# Patient Record
Sex: Male | Born: 1945
Health system: Southern US, Community
[De-identification: ages and names within clinical notes are randomized; demographics above are authoritative.]

## PROBLEM LIST (undated history)

## (undated) ENCOUNTER — Emergency Department (HOSPITAL_COMMUNITY): Payer: Medicare Other

## (undated) DIAGNOSIS — K222 Esophageal obstruction: Secondary | ICD-10-CM

## (undated) DIAGNOSIS — E669 Obesity, unspecified: Secondary | ICD-10-CM

## (undated) DIAGNOSIS — K219 Gastro-esophageal reflux disease without esophagitis: Secondary | ICD-10-CM

## (undated) DIAGNOSIS — L570 Actinic keratosis: Secondary | ICD-10-CM

## (undated) DIAGNOSIS — R079 Chest pain, unspecified: Secondary | ICD-10-CM

## (undated) DIAGNOSIS — L989 Disorder of the skin and subcutaneous tissue, unspecified: Secondary | ICD-10-CM

## (undated) DIAGNOSIS — R0789 Other chest pain: Secondary | ICD-10-CM

## (undated) DIAGNOSIS — R011 Cardiac murmur, unspecified: Secondary | ICD-10-CM

## (undated) DIAGNOSIS — Z8669 Personal history of other diseases of the nervous system and sense organs: Secondary | ICD-10-CM

## (undated) DIAGNOSIS — M1990 Unspecified osteoarthritis, unspecified site: Secondary | ICD-10-CM

## (undated) DIAGNOSIS — W57XXXA Bitten or stung by nonvenomous insect and other nonvenomous arthropods, initial encounter: Secondary | ICD-10-CM

## (undated) DIAGNOSIS — D485 Neoplasm of uncertain behavior of skin: Secondary | ICD-10-CM

## (undated) DIAGNOSIS — E785 Hyperlipidemia, unspecified: Secondary | ICD-10-CM

## (undated) DIAGNOSIS — R5381 Other malaise: Secondary | ICD-10-CM

## (undated) DIAGNOSIS — J309 Allergic rhinitis, unspecified: Secondary | ICD-10-CM

## (undated) DIAGNOSIS — R5383 Other fatigue: Secondary | ICD-10-CM

## (undated) DIAGNOSIS — M719 Bursopathy, unspecified: Secondary | ICD-10-CM

## (undated) DIAGNOSIS — M67919 Unspecified disorder of synovium and tendon, unspecified shoulder: Secondary | ICD-10-CM

## (undated) DIAGNOSIS — R7303 Prediabetes: Secondary | ICD-10-CM

## (undated) HISTORY — DX: Personal history of other diseases of the nervous system and sense organs: Z86.69

## (undated) HISTORY — DX: Hyperlipidemia, unspecified: E78.5

## (undated) HISTORY — PX: SKIN CANCER EXCISION: SHX779

## (undated) HISTORY — DX: Obesity, unspecified: E66.9

## (undated) HISTORY — DX: Prediabetes: R73.03

## (undated) HISTORY — DX: Allergic rhinitis, unspecified: J30.9

## (undated) HISTORY — DX: Chest pain, unspecified: R07.9

## (undated) HISTORY — DX: Disorder of the skin and subcutaneous tissue, unspecified: L98.9

## (undated) HISTORY — DX: Other malaise: R53.81

## (undated) HISTORY — DX: Neoplasm of uncertain behavior of skin: D48.5

## (undated) HISTORY — DX: Unspecified disorder of synovium and tendon, unspecified shoulder: M67.919

## (undated) HISTORY — DX: Esophageal obstruction: K22.2

## (undated) HISTORY — DX: Cardiac murmur, unspecified: R01.1

## (undated) HISTORY — DX: Unspecified osteoarthritis, unspecified site: M19.90

## (undated) HISTORY — DX: Other chest pain: R07.89

## (undated) HISTORY — DX: Other fatigue: R53.83

## (undated) HISTORY — DX: Actinic keratosis: L57.0

## (undated) HISTORY — DX: Gastro-esophageal reflux disease without esophagitis: K21.9

## (undated) HISTORY — DX: Bitten or stung by nonvenomous insect and other nonvenomous arthropods, initial encounter: W57.XXXA

## (undated) HISTORY — DX: Bursopathy, unspecified: M71.9

---

## 2006-07-26 ENCOUNTER — Ambulatory Visit: Payer: Self-pay | Admitting: Family Medicine

## 2006-07-28 ENCOUNTER — Ambulatory Visit: Payer: Self-pay | Admitting: Family Medicine

## 2006-08-03 ENCOUNTER — Ambulatory Visit: Payer: Self-pay

## 2006-08-09 ENCOUNTER — Ambulatory Visit: Payer: Self-pay

## 2006-08-09 ENCOUNTER — Encounter: Payer: Self-pay | Admitting: Cardiology

## 2006-08-10 ENCOUNTER — Ambulatory Visit: Payer: Self-pay | Admitting: Family Medicine

## 2006-09-02 ENCOUNTER — Ambulatory Visit: Payer: Self-pay | Admitting: Internal Medicine

## 2006-09-03 ENCOUNTER — Ambulatory Visit: Payer: Self-pay | Admitting: Gastroenterology

## 2006-09-16 LAB — HM COLONOSCOPY

## 2006-09-23 ENCOUNTER — Encounter (INDEPENDENT_AMBULATORY_CARE_PROVIDER_SITE_OTHER): Payer: Self-pay | Admitting: Specialist

## 2006-09-23 ENCOUNTER — Ambulatory Visit: Payer: Self-pay | Admitting: Internal Medicine

## 2007-10-12 ENCOUNTER — Telehealth: Payer: Self-pay | Admitting: Family Medicine

## 2007-11-30 ENCOUNTER — Ambulatory Visit: Payer: Self-pay | Admitting: Family Medicine

## 2007-11-30 DIAGNOSIS — M719 Bursopathy, unspecified: Secondary | ICD-10-CM

## 2007-11-30 DIAGNOSIS — M67919 Unspecified disorder of synovium and tendon, unspecified shoulder: Secondary | ICD-10-CM

## 2007-11-30 DIAGNOSIS — L570 Actinic keratosis: Secondary | ICD-10-CM

## 2007-11-30 DIAGNOSIS — Z8669 Personal history of other diseases of the nervous system and sense organs: Secondary | ICD-10-CM | POA: Insufficient documentation

## 2007-11-30 DIAGNOSIS — E669 Obesity, unspecified: Secondary | ICD-10-CM | POA: Insufficient documentation

## 2007-11-30 DIAGNOSIS — J309 Allergic rhinitis, unspecified: Secondary | ICD-10-CM

## 2007-11-30 DIAGNOSIS — K219 Gastro-esophageal reflux disease without esophagitis: Secondary | ICD-10-CM

## 2007-11-30 HISTORY — DX: Allergic rhinitis, unspecified: J30.9

## 2007-11-30 HISTORY — DX: Actinic keratosis: L57.0

## 2007-11-30 HISTORY — DX: Obesity, unspecified: E66.9

## 2007-11-30 HISTORY — DX: Personal history of other diseases of the nervous system and sense organs: Z86.69

## 2007-11-30 HISTORY — DX: Gastro-esophageal reflux disease without esophagitis: K21.9

## 2007-11-30 HISTORY — DX: Unspecified disorder of synovium and tendon, unspecified shoulder: M67.919

## 2007-12-01 ENCOUNTER — Encounter: Payer: Self-pay | Admitting: Family Medicine

## 2007-12-20 ENCOUNTER — Ambulatory Visit: Payer: Self-pay | Admitting: Family Medicine

## 2008-02-15 ENCOUNTER — Ambulatory Visit: Payer: Self-pay | Admitting: Family Medicine

## 2008-02-15 DIAGNOSIS — D485 Neoplasm of uncertain behavior of skin: Secondary | ICD-10-CM

## 2008-02-15 DIAGNOSIS — L989 Disorder of the skin and subcutaneous tissue, unspecified: Secondary | ICD-10-CM

## 2008-02-15 HISTORY — DX: Neoplasm of uncertain behavior of skin: D48.5

## 2008-02-15 HISTORY — DX: Disorder of the skin and subcutaneous tissue, unspecified: L98.9

## 2008-09-10 ENCOUNTER — Ambulatory Visit: Payer: Self-pay | Admitting: Family Medicine

## 2008-09-10 DIAGNOSIS — R5381 Other malaise: Secondary | ICD-10-CM

## 2008-09-10 DIAGNOSIS — R5383 Other fatigue: Secondary | ICD-10-CM

## 2008-09-10 HISTORY — DX: Other malaise: R53.81

## 2008-09-12 LAB — CONVERTED CEMR LAB
Albumin: 3.8 g/dL (ref 3.5–5.2)
Alkaline Phosphatase: 50 units/L (ref 39–117)
BUN: 11 mg/dL (ref 6–23)
Basophils Absolute: 0 10*3/uL (ref 0.0–0.1)
Basophils Relative: 0.2 % (ref 0.0–3.0)
Calcium: 9.4 mg/dL (ref 8.4–10.5)
Eosinophils Absolute: 0.2 10*3/uL (ref 0.0–0.7)
GFR calc Af Amer: 110 mL/min
GFR calc non Af Amer: 91 mL/min
Glucose, Bld: 116 mg/dL — ABNORMAL HIGH (ref 70–99)
Hemoglobin: 15.3 g/dL (ref 13.0–17.0)
Lymphocytes Relative: 24.1 % (ref 12.0–46.0)
MCHC: 35.7 g/dL (ref 30.0–36.0)
MCV: 91.7 fL (ref 78.0–100.0)
Neutro Abs: 5.3 10*3/uL (ref 1.4–7.7)
Neutrophils Relative %: 66.1 % (ref 43.0–77.0)
Potassium: 4 meq/L (ref 3.5–5.1)
RDW: 12.1 % (ref 11.5–14.6)
TSH: 2.47 microintl units/mL (ref 0.35–5.50)
Testosterone: 167.59 ng/dL — ABNORMAL LOW (ref 350.00–890)
Total Protein: 7 g/dL (ref 6.0–8.3)
Vit D, 1,25-Dihydroxy: 29 — ABNORMAL LOW (ref 30–89)
Vitamin B-12: 802 pg/mL (ref 211–911)

## 2010-12-07 ENCOUNTER — Encounter: Payer: Self-pay | Admitting: Otolaryngology

## 2011-04-03 NOTE — Assessment & Plan Note (Signed)
[facility]                             [facility] OFFICE NOTE   NAME:BORINGYale, [name]                        MRN:          [id number]  DATE:07/26/2006                            DOB:          May 31, 1946    CHIEF COMPLAINT:  A 65 year old white male here to establish new doctor.   HISTORY OF PRESENT ILLNESS:  [name] comes to clinic today with a typed  out note from his wife discussing six different reasons that she feels he  needs to see a doctor.  He states that he does not like coming to the doctor  and has not seen a doctor regularly in many years, other than an urgent care  physician.  The thing that is the most concern for him is the following:  1. Last night between 1:30 and 3 a.m. this morning, arose with a full      feeling in his abdomen that felt like gas.  He then went to the      bathroom, while he was on the toilet he stated he felt woozy and light-      headed.  He stated that he did pass some gas but did not have a bowel      movement and only moderately strained.  He denied any other feeling of      shortness of breath or palpitations at that point in time.  Following      the feeling of light headedness he proceeded to pass out next to the      toilet.  His wife said that he appeared sweaty and woke within 10 to 30      seconds. When he arose he said he felt 100% better, momentarily had      some chills and his fingers were tingly.  He denied headache, weakness      and his wife denied slurred speech.  He was acting within normal limits      earlier that day and feels like his normal self this morning.  He does      have somewhat of a slightly upset stomach but normal bowel movement      this morning.  He denies any pain with urinating, no increased urinary      frequency, no increased thirst.  He did take Melatonin last night for      sleep.  He denies any episode like this happening previously.  2. Difficulty swallowing:  For  many years he has had intermittent      difficulty swallowing especially foods such as carrots and chicken.  In      August of this past year he had a severe bout at a scout gathering in      which chicken seemed to not be able to move down his esophagus.  He has      never had endoscopy.  He denies heartburn symptoms, chest pain.  He      denies this issue with liquids.  3. Fatigue, chronic:  He states briefly that he has been fatigued off and  on for quite some time.  He has never had this worked up by a doctor.  4. Left shoulder pain:  He said he injured this about 1-2 months ago while      lifting weights.  He has some increased pain with left shoulder      abduction and internal rotation.  He feels it is almost healed, but his      wife is concerned about it.  5. Lesions on back:  His wife has some concern about some of the spots on      his back to make sure that he does not need to see a dermatologist.   PAST MEDICAL HISTORY:  None.   HOSPITALIZATIONS, SURGERY, PROCEDURES:  None.   PREVENTION:  None.   ALLERGIES:  No known drug allergies.   MEDICATIONS:  1. Multivitamin occasionally.  2. Melatonin occasionally.   REVIEW OF SYSTEMS:  Otherwise negative.  See HPI.   FAMILY HISTORY:  Father deceased age 40 with CVA.  Mother deceased at age 14  with motor vehicle accident, but did have hypertension. He has one sister  who is healthy.  There is no family history of prostate cancer, breast  cancer or colon cancer.   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  He has been  married for 28 years and has 2 children.  He does lift some weights and  bicycles about 10 miles every other day indoors.  He eats 3 meals a day and  tries to get lots of fluids.  He has recently stopped coke and has changed  to green tea in attempt to decrease caffeine in soda.   EXAMINATION:  VITAL SIGNS:  Height 57 1/2 inches.  Weight 197 making BMI  approximately 30.  Blood  pressure 130/86.  Pulse 92.  Temperature 98.2.  GENERAL:  Well-appearing male in no apparent distress.  HEENT:  PERLA extra ocular muscles intact.  Oropharynx clear.  Naris clear.  Tympanic membranes clear.  No lymphadenopathy, cervical or supraclavicular.  No thyromegaly.  CARDIOVASCULAR:  Regular rate and rhythm.  No murmurs, rubs or gallops.  Normal PMI.  2+ peripheral pulses.  PULMONARY:  Clear to auscultation bilaterally.  No wheezes, rales or  rhonchi.  ABDOMEN:  Soft. Obese.  Non-tender, normoactive bowel sounds, no  hepatosplenomegaly.  MUSCULOSKELETAL:  Strength 5/5 upper and lower extremities.  Gait was within  normal limits.  NEUROLOGICAL:  Alert and oriented x3.  Cranial nerves II through XII grossly  intact.  Reflex is 2+, sensation to touch intact in upper and lower  extremities.  SKIN:  Multiple seborrheic keratoses on back.   PROCEDURE:  EKG:  Q waves in lead 3 but otherwise no Q waves, no ST changes,  normal sinus rhythm.   ASSESSMENT/PLAN:  1. Syncopal event:  Will obtain several lab tests including CBC with      differentials, CMET, TSH.  I do feel most likely cause of his syncopal      event was situational vasovagal syncope secondary to defecation and      straining.  He may have had a side effect to Melatonin as well.  I do      have some concern about his coronary artery disease risk and given the      slightly abnormal EKG, if his lab tests come back within normal limits      I will encourage him to get a Cardiolite exam.  His description of  his      syncopal event does not seem to be proceeded by a cardiac event, nor      seem to be associated with a transient ischemic attack given his lack      of symptoms.  2. Dysphagia, chronic:  This sounds most like an esophageal stricture.  If      this continues to bother him, I recommended a referral to      gastroenterologist for possible stricture dilation or at least     esophagogastroduodenoscopy to evaluate  it.  3. Chronic fatigue:  I will begin with initial evaluation with some of the      labs as stated above.  In addition I will also check a vitamin B12.  If      his symptoms continue we can consider further workup.  4. Left rotator cuff tendinitis:  We discussed anti-inflammatories, heat      and rotator cuff exercises.  He will begin this conservative treatment      and will let me know if he continues to have problems.  5. Skin lesions:  These are benign seborrheic keratoses.  Patient was      reassured and there is no need to biopsy these lesions.                                   Kerby Nora, MD   AB/MedQ  DD:  07/26/2006  DT:  07/27/2006  Job #:  [id number]

## 2011-04-03 NOTE — Assessment & Plan Note (Signed)
[facility]                           GASTROENTEROLOGY OFFICE NOTE   NAME:[name], [name]                        MRN:          [id number]  DATE:09/02/2006                            DOB:          1946-06-21    REFERRING PHYSICIAN:  Kerby Nora, MD   REASON FOR CONSULTATION:  Abdominal pain and dysphagia.   HISTORY:  This is a 65 year old white male with no significant past medical  history who was referred through the courtesy of Dr. Ermalene Searing regarding  abdominal discomfort and dysphagia. First, the patient forged developing  problems with vague generalized mid abdominal pain about 4 weeks ago. At one  point in early September, he awoke with abdominal discomfort and had a  syncopal episode. He saw Dr. Ermalene Searing the following day. CBC comprehensive  metabolic panel, thyroid testing, B12 and folate levels were unremarkable.  He subsequently submitted to a cardiac nuclear stress test as well as a  transthoracic echocardiogram neither of which revealed significant  abnormalities. He had been on ibuprofen and aspirin, the former for  persistent shoulder pain. There were no problems with nausea, vomiting,  melena or hematochezia. He did describe his bowel habits as somewhat looser  and a bit yellow. He was placed on Prilosec which he has been on for  approximately 18 days. He has had no abdominal discomfort over the past  week. His stools have returned to more normal color. He attributes  his  improvement to pain medication for his shoulder which helps him sleep at  night. He is no longer using nonsteroidal antiinflammatory drugs. He denies  prior history of ulcer disease or gastrointestinal problems. No radiation  associated with the pain. He has had chronic intermittent solid food  dysphagia with rare episodes of heartburn. There has been no weight loss. He  does not smoke or use alcohol.   PAST MEDICAL HISTORY:  None.   PAST SURGICAL HISTORY:   None.   ALLERGIES:  No known drug allergies.   FAMILY HISTORY:  No family history of gastrointestinal malignancy. Father  had a history of stroke.   SOCIAL HISTORY:  The patient is married with 2 children. 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 He does not  smoke or use alcohol.   REVIEW OF SYSTEMS:  Per diagnostic evaluation form.   PHYSICAL EXAMINATION:  GENERAL:  Well-appearing in no acute distress.  VITAL SIGNS:  Blood pressure is 120/80, heart rate is 80 and regular, weight  is 189.6 pounds. He is 5 feet 6 inches in height.  HEENT:  Sclera are anicteric. Conjunctiva are pink. Oral mucosa is intact.  There is no adenopathy.  LUNGS:  Clear.  HEART:  Regular.  ABDOMEN:  Soft without tenderness, mass or hernia. Good bowel sounds heard.  No organomegaly.  RECTAL:  Deferred.  EXTREMITIES:  Without edema.   IMPRESSION:  1. Recent problems with generalized abdominal discomfort of uncertain      cause. Seemingly improved after initiation of Prilosec and      discontinuation of nonsteroidal antiinflammatory drugs. This discomfort  may have represented ulcer disease. With slight change in bowel habits      cannot exclude primary colonic pathology. As well, occult gallbladder      disease possible though less likely.  2. Chronic intermittent solid food dysphagia with some history of      indigestion and heartburn. Rule out peptic stricture versus web or ring      of the esophagus.  3. Colon cancer screening. None previous. Baseline candidate without      contraindication.   RECOMMENDATIONS:  1. Colonoscopy (with polypectomy if necessary) to provide colon cancer      screening as well as evaluate abdominal complaints. The nature of the      procedure as well as its risks, benefits, and alternatives were      carefully discussed in detail. He understood, asked questions and      agreed to proceed.  2. Upper endoscopy with possible  esophageal dilation. Again, the nature of      this procedure as well as its risks, benefits, and alternatives were      again reviewed. He understood and agreed to proceed.  3. Continue Prilosec.  4. Schedule abdominal ultrasound.  5. Ongoing general medical care with Dr. Ermalene Searing.            ______________________________  Wilhemina Bonito. Eda Keys., MD      JNP/MedQ  DD:  09/06/2006  DT:  09/07/2006  Job #:  [id number]   cc:   Kerby Nora, MD

## 2011-08-19 ENCOUNTER — Encounter: Payer: Self-pay | Admitting: Family Medicine

## 2011-08-21 ENCOUNTER — Encounter: Payer: Self-pay | Admitting: Family Medicine

## 2011-08-21 ENCOUNTER — Ambulatory Visit (INDEPENDENT_AMBULATORY_CARE_PROVIDER_SITE_OTHER): Payer: Medicare Other | Admitting: Family Medicine

## 2011-08-21 VITALS — BP 140/80 | HR 88 | Temp 98.5°F | Ht 66.0 in | Wt 206.8 lb

## 2011-08-21 DIAGNOSIS — Z23 Encounter for immunization: Secondary | ICD-10-CM

## 2011-08-21 DIAGNOSIS — D485 Neoplasm of uncertain behavior of skin: Secondary | ICD-10-CM

## 2011-08-21 NOTE — Assessment & Plan Note (Signed)
Area may be slow healing scab given his manipulation of area, but also concerning for carcinoma given no healing in high risk area in high risk person.  Refert to derm for eval and removal if needed given location on face.

## 2011-08-21 NOTE — Patient Instructions (Signed)
Stop at front desk for referral to Derm. Make appt for fasting labs followed by Annual Medicare Wellness.

## 2011-08-21 NOTE — Progress Notes (Signed)
  Subjective:    Patient ID: [name], male    DOB: November 13, 1946, 65 y.o.   MRN: [id number]  HPI  65 year old male presents with  lesion on forehead... noted first 5 weeks.  Appeared like a scab, may be decreasing in size some. At first pulled scab pff Not itchy, not irritated. Occ bleeding. May have hit forehead with pen, but not sure if this is the area.   Applying neosporin daily, occ alcohol.   No family or personal history of skin cancer.     Review of Systems  Constitutional: Negative for fever and fatigue.  HENT: Negative for ear pain.   Eyes: Negative for pain.       Objective:   Physical Exam  Constitutional: He appears well-developed and well-nourished.  Eyes: Conjunctivae are normal. Pupils are equal, round, and reactive to light.  Cardiovascular: Normal rate, regular rhythm, normal heart sounds and intact distal pulses.  Exam reveals no gallop and no friction rub.   No murmur heard. Pulmonary/Chest: Effort normal and breath sounds normal. No respiratory distress. He has no wheezes. He has no rales. He exhibits no tenderness.  Skin: Skin is dry. Lesion noted.       Pale 0.5 cm lesion right central forehead with scab on lwer half, no flake, no blister, no pustule  He has very fair... No reddish skin          Assessment & Plan:

## 2011-11-12 ENCOUNTER — Other Ambulatory Visit: Payer: Self-pay | Admitting: Dermatology

## 2011-11-18 ENCOUNTER — Telehealth: Payer: Self-pay | Admitting: Family Medicine

## 2011-11-18 ENCOUNTER — Other Ambulatory Visit: Payer: Medicare Other

## 2011-11-18 DIAGNOSIS — Z125 Encounter for screening for malignant neoplasm of prostate: Secondary | ICD-10-CM

## 2011-11-18 DIAGNOSIS — Z8669 Personal history of other diseases of the nervous system and sense organs: Secondary | ICD-10-CM

## 2011-11-18 DIAGNOSIS — E669 Obesity, unspecified: Secondary | ICD-10-CM

## 2011-11-18 DIAGNOSIS — Z13220 Encounter for screening for lipoid disorders: Secondary | ICD-10-CM

## 2011-11-18 NOTE — Telephone Encounter (Signed)
Message copied by Excell Seltzer on Wed Nov 18, 2011  4:31 PM ------      Message from: Alvina Chou      Created: Wed Nov 11, 2011  9:30 AM      Regarding: lab orders for 11-18-11       Patient is scheduled for CPX labs, please order future labs, Thanks , [name]

## 2011-11-23 ENCOUNTER — Encounter: Payer: Medicare Other | Admitting: Family Medicine

## 2011-11-24 ENCOUNTER — Encounter: Payer: Medicare Other | Admitting: Family Medicine

## 2011-11-26 ENCOUNTER — Encounter: Payer: Self-pay | Admitting: Family Medicine

## 2011-11-26 ENCOUNTER — Ambulatory Visit (INDEPENDENT_AMBULATORY_CARE_PROVIDER_SITE_OTHER): Payer: Medicare Other | Admitting: Family Medicine

## 2011-11-26 ENCOUNTER — Telehealth: Payer: Self-pay | Admitting: Internal Medicine

## 2011-11-26 DIAGNOSIS — R079 Chest pain, unspecified: Secondary | ICD-10-CM

## 2011-11-26 HISTORY — DX: Chest pain, unspecified: R07.9

## 2011-11-26 NOTE — Assessment & Plan Note (Signed)
EKG NSR, no ST no Q changes. Chest pain is not clearly cardiac but initially was exertional. He has some risk factors... Will assess others such as chol and DM screen ASAP.  If these put him at higher risk or if recurrent pain.. Consider cardiac stress test.  At this point chest pain seems most consistent with chest wall pain , secondary to lifting weights/ injury. He thinks he may have dropped the weights on his chest. Chest wall is tender to touch.

## 2011-11-26 NOTE — Patient Instructions (Signed)
Move fasting labs to as soon as possible, this week if possible or early next week. If chest pain recurs, call for further evaluation. If severe chest pain or SOB.. Go to ER.

## 2011-11-26 NOTE — Progress Notes (Signed)
  Subjective:    Patient ID: [name], male    DOB: July 16, 1946, 66 y.o.   MRN: [id number]  HPI  66 year old male with history of GERD and chronic fatigue presents with new onset chest pain 4 days ago. Left central tender to touch constant, intermittent sharper twinge pain when walking. Improved with rest. He has felt gradually less issue, feels better today. No SOB associated, no numbness, no sweats. Fatigue increased 4 days ago.  Walked for 1 hour today, and felt better.. No current exertional chest pain.  Increase in stress this week, with work. 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  Lifts weights .Marland Kitchen Started back two days prior to chest pain.  Cardiac risk factor: age, central obesity, no HTN, chol and DM screen scheduled, no early MI history in family, father with CVA age 71s, sister CVA age 40.  Had nml ECHO in 2009 Nml stress nuclear study in 2007       Review of Systems  Constitutional: Negative for fever and fatigue.  HENT: Negative for ear pain.   Eyes: Negative for pain.  Respiratory: Negative for cough and shortness of breath.   Cardiovascular: Negative for palpitations and leg swelling.  Gastrointestinal: Negative for abdominal pain.      Objective:   Physical Exam  Constitutional: Vital signs are normal. He appears well-developed and well-nourished.  HENT:  Head: Normocephalic.  Right Ear: Hearing normal.  Left Ear: Hearing normal.  Nose: Nose normal.  Mouth/Throat: Oropharynx is clear and moist and mucous membranes are normal.  Neck: Trachea normal. Carotid bruit is not present. No mass and no thyromegaly present.  Cardiovascular: Normal rate, regular rhythm and normal pulses.  Exam reveals no gallop, no distant heart sounds and no friction rub.   No murmur heard.      No peripheral edema  Pulmonary/Chest: Effort normal and breath sounds normal. No respiratory distress. Chest wall is not dull to percussion. He  exhibits tenderness. He exhibits no mass, no crepitus and no deformity.    Skin: Skin is warm, dry and intact. No rash noted.  Psychiatric: He has a normal mood and affect. His speech is normal and behavior is normal. Thought content normal.          Assessment & Plan:

## 2011-11-26 NOTE — Telephone Encounter (Signed)
Made him appointment with Dr. Lupita Shutter doctor at 2 for mild chest pains and tiredness, no other symptoms.

## 2011-11-27 ENCOUNTER — Telehealth: Payer: Self-pay | Admitting: Internal Medicine

## 2011-11-27 ENCOUNTER — Other Ambulatory Visit (INDEPENDENT_AMBULATORY_CARE_PROVIDER_SITE_OTHER): Payer: Medicare Other

## 2011-11-27 DIAGNOSIS — R7303 Prediabetes: Secondary | ICD-10-CM

## 2011-11-27 DIAGNOSIS — Z125 Encounter for screening for malignant neoplasm of prostate: Secondary | ICD-10-CM

## 2011-11-27 DIAGNOSIS — E669 Obesity, unspecified: Secondary | ICD-10-CM

## 2011-11-27 DIAGNOSIS — E7800 Pure hypercholesterolemia, unspecified: Secondary | ICD-10-CM | POA: Insufficient documentation

## 2011-11-27 HISTORY — DX: Prediabetes: R73.03

## 2011-11-27 LAB — COMPREHENSIVE METABOLIC PANEL
Albumin: 4.4 g/dL (ref 3.5–5.2)
Alkaline Phosphatase: 49 U/L (ref 39–117)
BUN: 15 mg/dL (ref 6–23)
Glucose, Bld: 102 mg/dL — ABNORMAL HIGH (ref 70–99)
Potassium: 4.7 mEq/L (ref 3.5–5.1)

## 2011-11-27 LAB — LIPID PANEL
Cholesterol: 203 mg/dL — ABNORMAL HIGH (ref 0–200)
Total CHOL/HDL Ratio: 4
Triglycerides: 95 mg/dL (ref 0.0–149.0)

## 2011-11-27 NOTE — Telephone Encounter (Signed)
I needed his fasting Labs moved up,... Is this what he is talking about or did he have a wellness visit as well (this does not need to be moved up)?  the labs need to be done at least by next week.

## 2011-11-27 NOTE — Telephone Encounter (Signed)
Patient advised.

## 2011-11-27 NOTE — Telephone Encounter (Signed)
Patient stated you requested his Well visit be moved up until next week.  He made an appointment for January 25 and he wanted to know if this was ok or if you want his appt. Sooner.  Please advise.

## 2011-12-02 ENCOUNTER — Encounter: Payer: Self-pay | Admitting: Cardiovascular Disease

## 2011-12-02 ENCOUNTER — Encounter: Payer: Self-pay | Admitting: Family Medicine

## 2011-12-02 ENCOUNTER — Encounter: Payer: Self-pay | Admitting: Cardiology

## 2011-12-02 ENCOUNTER — Ambulatory Visit (INDEPENDENT_AMBULATORY_CARE_PROVIDER_SITE_OTHER): Payer: Medicare Other | Admitting: Family Medicine

## 2011-12-02 ENCOUNTER — Ambulatory Visit (INDEPENDENT_AMBULATORY_CARE_PROVIDER_SITE_OTHER): Payer: Medicare Other | Admitting: Cardiovascular Disease

## 2011-12-02 DIAGNOSIS — R0789 Other chest pain: Secondary | ICD-10-CM | POA: Insufficient documentation

## 2011-12-02 DIAGNOSIS — R079 Chest pain, unspecified: Secondary | ICD-10-CM

## 2011-12-02 DIAGNOSIS — K219 Gastro-esophageal reflux disease without esophagitis: Secondary | ICD-10-CM

## 2011-12-02 HISTORY — DX: Other chest pain: R07.89

## 2011-12-02 MED ORDER — SUCRALFATE 1 GM/10ML PO SUSP: 1.0000 g | Freq: Four times a day (QID) | ORAL | Status: DC

## 2011-12-02 MED ORDER — PANTOPRAZOLE SODIUM 40 MG PO TBEC: 40.0000 mg | DELAYED_RELEASE_TABLET | Freq: Two times a day (BID) | ORAL | Status: DC

## 2011-12-02 NOTE — Progress Notes (Signed)
Patient ID: [name], male    DOB: 09-14-1946, 66 y.o.   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 He presents by referral from Dr. Ermalene Searing.   He reports that initially his chest pain started as a burning that has become progressively worse at nighttime. He denies any change in his diet. The symptoms feel somewhat different from his previous GERD symptoms. He is taking Carafate, has not started his proton pump inhibitor and has been taking low dose Zantac.  He has had such severe burning in his chest when supine but he has not been able to sleep for the past few days. Symptoms are relieved with sitting up.   He has been walking [place] a regular basis [place] the track and has not had any symptoms of chest discomfort with exertion. He has also had a pain in the left side of his mediastinum that is reproducible with palpation. He has been lifting some weights and wonders if he may have pulled something.   EKG shows normal sinus rhythm with rate 82 beats per minute with no significant ST or T wave changes   Outpatient Encounter Prescriptions as of 12/02/2011  Medication Sig Dispense Refill  . ALPHA LIPOIC ACID POWD Once daily       . cholecalciferol (VITAMIN D) 1000 UNITS tablet Take 1,000 Units by mouth daily.      . Melatonin 1 MG CAPS As needed.       . Multiple Vitamins-Minerals (CENTRUM SILVER PO) Take one by mouth daily       . pantoprazole (PROTONIX) 40 MG tablet Take 1 tablet (40 mg total) by mouth 2 (two) times daily.  60 tablet  5  . sucralfate (CARAFATE) 1 GM/10ML suspension Take 10 mLs (1 g total) by mouth 4 (four) times daily.  420 mL  0  . vitamin B-12 (CYANOCOBALAMIN) 1000 MCG tablet Take 1,000 mcg by mouth daily.        . vitamin C (ASCORBIC ACID) 500 MG tablet Take 500 mg by mouth daily.           Review of  Systems  Constitutional: Negative.   HENT: Negative.   Eyes: Negative.   Respiratory: Negative.   Cardiovascular: Positive for chest pain.       Chest burning when in a supine position. Also reproducible chest pain with palpation on the left side of his mediastinum  Gastrointestinal: Negative.   Musculoskeletal: Negative.   Skin: Negative.   Neurological: Negative.   Hematological: Negative.   Psychiatric/Behavioral: Negative.   All other systems reviewed and are negative.    BP 140/68  Pulse 82  Ht 5\' 6"  (1.676 m)  Wt 196 lb (88.905 kg)  BMI 31.64 kg/m2   Physical Exam  Nursing note and vitals reviewed. Constitutional: He is oriented to person, place, and time. He appears well-developed and well-nourished.  HENT:  Head: Normocephalic.  Nose: Nose normal.  Mouth/Throat: Oropharynx is clear and moist.  Eyes: Conjunctivae are normal. Pupils are equal, round, and reactive to light.  Neck: Normal range of motion. Neck supple. No JVD present.  Cardiovascular: Normal rate, regular rhythm, S1 normal, S2 normal, normal heart sounds and intact distal pulses.  Exam reveals no gallop and no friction rub.   No murmur heard. Pulmonary/Chest: Effort normal and breath sounds normal. No respiratory  distress. He has no wheezes. He has no rales. He exhibits no tenderness.  Abdominal: Soft. Bowel sounds are normal. He exhibits no distension. There is no tenderness.  Musculoskeletal: Normal range of motion. He exhibits no edema and no tenderness.       Chest wall tenderness on the left side of the mediastinum  Lymphadenopathy:    He has no cervical adenopathy.  Neurological: He is alert and oriented to person, place, and time. Coordination normal.  Skin: Skin is warm and dry. No rash noted. No erythema.  Psychiatric: He has a normal mood and affect. His behavior is normal. Judgment and thought content normal.           Assessment and Plan

## 2011-12-02 NOTE — Assessment & Plan Note (Signed)
Etiology of his burning chest pain when supine at night time is uncertain. Unable to exclude GERD as well as pericarditis. Also possible other musculoskeletal issue. We have suggested he continue his aggressive stomach/GERD medication regiment. If he has no improvement of his symptoms, I have suggested he try high-dose ibuprofen 600 up to 800 mg t.i.d.. If he has improvement of his pain with this, we would do a slow wean off the ibuprofen over 2 weeks.   If symptoms persist despite NSAIDs and PPIs, further workup could be done. Symptoms do not seem like underlying coronary artery disease as they are nonexertional, accentuated by change in position.
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
## 2011-12-02 NOTE — Patient Instructions (Signed)
You are doing well. If you try your mix of stomach medications with no relief, Try high dose ibuprofen 600 to 800 mg three times a day for 5 days. Call the office to let us know about your symptoms. If the ibuprofen helps, we will come down on the ibuprofen slowly.   Please call us if you have new issues that need to be addressed before your next appt.  Your physician wants you to follow-up in: 1 months.  You will receive a reminder letter in the mail two months in advance. If you don't receive a letter, please call our office to schedule the follow-up appointment.

## 2011-12-02 NOTE — Patient Instructions (Signed)
Diet for GERD or PUD Nutrition therapy can help ease the discomfort of gastroesophageal reflux disease (GERD) and peptic ulcer disease (PUD).  HOME CARE INSTRUCTIONS   Eat your meals slowly, in a relaxed setting.   Eat 5 to 6 small meals per day.   If a food causes distress, stop eating it for a period of time.  FOODS TO AVOID  Coffee, regular or decaffeinated.   Cola beverages, regular or low calorie.   Tea, regular or decaffeinated.   Pepper.   Cocoa.   High fat foods, including meats.   Butter, margarine, hydrogenated oil (trans fats).   Peppermint or spearmint (if you have GERD).   Fruits and vegetables if not tolerated.   Alcohol.   Nicotine (smoking or chewing). This is one of the most potent stimulants to acid production in the gastrointestinal tract.   Any food that seems to aggravate your condition.  If you have questions regarding your diet, ask your caregiver or a registered dietitian. TIPS  Lying flat may make symptoms worse. Keep the head of your bed raised 6 to 9 inches (15 to 23 cm) by using a foam wedge or blocks under the legs of the bed.   Do not lay down until 3 hours after eating a meal.   Daily physical activity may help reduce symptoms.  MAKE SURE YOU:   Understand these instructions.   Will watch your condition.   Will get help right away if you are not doing well or get worse.  Document Released: 11/02/2005 Document Revised: 07/15/2011 Document Reviewed: 03/18/2009 [employer] Patient Information 2012 [employer]. 

## 2011-12-02 NOTE — Assessment & Plan Note (Signed)
He will continue his Carafate, Zantac b.i.d., Protonix. If symptoms persist, he may need repeat EGD. I do have some concern that this could be secondary to pericarditis and we will try NSAIDs if there is no relief with GI cocktail.

## 2011-12-04 ENCOUNTER — Telehealth: Payer: Self-pay | Admitting: *Deleted

## 2011-12-04 ENCOUNTER — Telehealth: Payer: Self-pay | Admitting: Cardiovascular Disease

## 2011-12-04 NOTE — Telephone Encounter (Signed)
Yes protonix and carafate should help protect his stomach. Still if stomach upset with ibuprofen.. Stop and call us.

## 2011-12-04 NOTE — Telephone Encounter (Signed)
Patient was seen on Wed and was told to call back per Dr. Mariah Milling if symptoms persist or worsen.  Patient tried medications that were suggested and still has had no relief.  Still having the nagging chest pain and not able to sleep the past two nights.  Would like to know what he should do, please advise?

## 2011-12-04 NOTE — Telephone Encounter (Signed)
Patient's wife advised

## 2011-12-04 NOTE — Telephone Encounter (Signed)
Will forward to PCP 

## 2011-12-04 NOTE — Telephone Encounter (Signed)
A Rx for vicodin 5/500 mg take 1 to 2 tablets daily prn was called to [facility].

## 2011-12-04 NOTE — Telephone Encounter (Signed)
The patient states Dr. Mariah Milling mentioned a knot on on left upper chest (possibly rib related) popped a rib.  He would like a pain pill that was suggested by Dr. Mariah Milling sent to [facility].

## 2011-12-04 NOTE — Telephone Encounter (Signed)
The patient states it is not so much the burning sensation anymore its more the knot/rib issue.  I told the patient Dr. Mariah Milling would call in vicodin and have patient use warm compress over the weekend to see if this would help with knot/rib pain.  He will call back on Monday if symptoms not any better.

## 2011-12-04 NOTE — Telephone Encounter (Signed)
Patient has seen Dr. Patsy Lager and Dr. Mariah Milling and wants you to review their notes. Patient's wife states that patient is having a lot of pain in his chest area which could possibly to be a cracked rib. They have talked with Dr. Windell Hummingbird office today and he is going to prescirbe him Vicodin and he has suggested that patient take large doses of Ibuprofen also. Patient wants to know if the Protonix and Carafate that he is taking is going to coat his stomach enough to take the medications that Dr. Mariah Milling is advising him to take. Please advise.

## 2011-12-04 NOTE — Telephone Encounter (Signed)
Options include setting him up for a chest CT scan at [facility], We could have him do cardiac enz today as well (STAT).  We can schedule a myoview study for next week

## 2011-12-08 ENCOUNTER — Telehealth: Payer: Self-pay | Admitting: Family Medicine

## 2011-12-08 DIAGNOSIS — R0789 Other chest pain: Secondary | ICD-10-CM

## 2011-12-08 DIAGNOSIS — M25512 Pain in left shoulder: Secondary | ICD-10-CM

## 2011-12-08 NOTE — Telephone Encounter (Signed)
Please advise 

## 2011-12-08 NOTE — Telephone Encounter (Signed)
Patient advised.

## 2011-12-08 NOTE — Telephone Encounter (Signed)
Let pt know ideally I would recommend reeval in our office, but given he is frustrated with being seen multiple times... Given pain now more focused around  Left shoulder and cardiologist thought MSK could be source..will go ahead and refer to PT.  If relfux continuing I would also recommend further evaluationwith GI of GERD.

## 2011-12-08 NOTE — Telephone Encounter (Signed)
Triage Record Num: [id number] Operator: Chevis Pretty Patient Name: [name] Call Date & Time: 12/08/2011 10:21:01AM Patient Phone: [phone] PCP: Kerby Nora Patient Gender: Male PCP Fax : [phone] Patient DOB: 1946-10-26 Practice Name: [facility] Reason for Call: Caller: [place]/Patient; PCP: Excell Seltzer.; CB#: [phone]; ; ; Call regarding Chest Pain/Chest Discomfort. States having pain in upper L chest and top of L shoulder where rotator cuff needed physical therapy. No history of shoulder surgery. Onset of pain 11/17/11. States has seen PCP, cardiologist. Wants number for physical therapist he saw post op rotator cuff. Seen in office x 2 and thought there might be a rib injury. Cardiologist told them he could not tell if rib displaced, or cartilage tear. States he was referred 5 years ago to PT for shoulder/arm pain before, and thinks this may be a flareup of that. Denies emergent symptoms per protocol; declines appt at this time, as is very frustrated with coming to various doctors and continuing to have pain. Info to office for provider review/referral/call back. Prefers PT vs ortho referral. May reach patient [phone] or [phone]. Protocol(s) Used: Chest Pain Recommended Outcome per Protocol: See Provider within 72 Hours Reason for Outcome: Pain brought on by, or made worse by, pressure on a localized area (such as rib) AND not previously evaluated Frequent episodes of indigestion/reflux Care Advice: ~ Call EMS 911 if worsening shortness of breath or swelling in upper chest or neck occurs Call EMS 911 if having any of the following symptoms: chest, neck, jaw, arm, shoulder, or upper back pain; or shortness of breath at rest. ~ Do not take aspirin, ibuprofen, ketoprofen, naproxen, etc., or other pain relieving medications until consulting with provider. ~ Call provider if fever greater than 101.5 F (38.6 C) or 100.5 F (38.1C) in an  immunocompromised patient (such as diabetes, HIV/AIDS, renal disease, chemotherapy, organ transplant, or chronic steroid use) has not improved in 24 hours. ~ Call provider if difficulty breathing or wheezing develops or if cough becomes productive of green, yellow or brown sputum ~ Talk to provider immediately if having repeated episodes pain/discomfort in lower chest or back; indigestion not relieved with antacids; shortness of breath without chest pain; or unexplained fatigue, weakness or anxiety. ~ Call provider immediately if develop severe pain, black, tarry stools, bloody stools, blood-streaked or coffee ground-looking vomitus, or abdomen swollen. ~ ~ SYMPTOM / CONDITION MANAGEMENT ~ CAUTIONS Consider nonprescription low-sodium antacids (i.e. Mylanta, Maalox, Tums, Gelusil), H-2-receptor blockers (Tagamet HB, Pepcid AC, Zantac), or a proton pump inhibitor (Prilosec) following package directions or provider instructions. ~ Analgesic/Antipyretic Advice - Acetaminophen: Consider acetaminophen as directed on label or by pharmacist/provider for pain or fever PRECAUTIONS: - Use if there is no history of liver disease, alcoholism, or intake of three or more alcohol drinks per day - Only if approved by provider during pregnancy or when breastfeeding - During pregnancy, acetaminophen should not be taken more than 3 consecutive days without telling provider - Do not exceed recommended dose or frequency ~ 12/08/2011 10:34:51AM Page 1 of 2 [id number] [facility] Triage Call Report Patient Name: Kalvin Buss continuation page/s - Do not exceed recommended dose or frequency Discontinue nonprescribed medications and complementary/alternative medications. Continue prescribed medication(s) at ordered dosage/frequency until discussed with provider. ~ Analgesic/Antipyretic Advice - NSAIDs: Consider aspirin, ibuprofen, naproxen or ketoprofen for pain or fever as directed on label or by  pharmacist/provider. PRECAUTIONS: If over 67 years of age, should not take longer than 1 week without consulting provider. EXCEPTIONS: -  Should not be used if taking blood thinners or have bleeding problems. - Do not use if have history of sensitivity/allergy to any of these medications; or history of cardiovascular, ulcer, kidney, liver disease or diabetes unless approved by provider. - Do not exceed recommended dose or frequency. ~ Heartburn Relief (Dietary): - Avoid overeating. - Eat smaller, more frequent meals and chew each bite thoroughly. - Avoid high fat, spicy or gas-producing foods. - Limit liquids/foods that are gastric irritants (fruit juices, caffeinated, carbonated or alcoholic beverages, chocolate and dairy products). - Eat at least three hours before bedtime. ~ Heartburn Relief (Positioning): - Avoid bending over at the waist or lying flat soon after eating. - Avoid clothing that is tight around the abdomen and waist. - Sleeping on stacked pillows or raising the head of bed on 6 inch blocks may help prevent reflux. ~ 12/08/2011 10:34:51AM Page 2 of 2 [email or web address]_[email or web address]

## 2011-12-09 ENCOUNTER — Telehealth: Payer: Self-pay | Admitting: *Deleted

## 2011-12-09 NOTE — Telephone Encounter (Signed)
Sorry Jovita Gamma! I meant to send the note to Amy B.

## 2011-12-09 NOTE — Telephone Encounter (Signed)
If pain is present with pushing on that third rib, that will need vicodin/ibuprofen, Possible chiropractic adjustment, ice/cold pack may help.  Could do chest CT scan if he would like to exclude other causes. Also cardiac labs to help exclude the heart.  If only burning when lying down and possible pericarditis, would continue IBP (as well as stomach meds) Could try colchicine 0.6 mg TID with IBP.

## 2011-12-09 NOTE — Telephone Encounter (Signed)
Called pt to follow up with his CP after taking Ibuprofen and Vicodin. He states overall still feels about the same, if there is some improvement, it is very slow. Still c/o burning sensation and pain that is "hard to describe," on left side of chest, around 3rd rib and shoulder. He has no additional symptoms, denies SOB, diaphoresis, numbness, etc. I told pt I will notify Dr. Mariah Milling and advised he call back sooner with any changes. Otherwise will call him back with recommendations.

## 2011-12-09 NOTE — Telephone Encounter (Signed)
Spoke to pt, he asked if could use Prednisone to replace current pain meds (easier on stomach); told him Dr. Mariah Milling said not to use, but did suggest the colchicine that can be used for pericarditis. In the meantime he is going to continue Vicodin and IBP, he will see Dr. Ermalene Searing this Friday, he is going to start PT. He did state may go to [facility]. I recc CT scan or labs, he did not want to do at this time.

## 2011-12-10 ENCOUNTER — Telehealth: Payer: Self-pay | Admitting: Cardiovascular Disease

## 2011-12-10 NOTE — Telephone Encounter (Signed)
The patient is requesting vicodin now for his pain. You had mentioned from a previous phone note may need to try vicodin. He has been taking the ibuprofen but not helping. Please advise if you want me to send a Rx for vicodin.

## 2011-12-10 NOTE — Telephone Encounter (Signed)
Pt calling requesting a refill on his vicotin. States he needs it to sleep and he is almost out. [facility]

## 2011-12-11 ENCOUNTER — Telehealth: Payer: Self-pay | Admitting: *Deleted

## 2011-12-11 ENCOUNTER — Encounter: Payer: Medicare Other | Admitting: Family Medicine

## 2011-12-11 MED ORDER — COLCHICINE 0.6 MG PO TABS: 0.6000 mg | ORAL_TABLET | Freq: Two times a day (BID) | ORAL | Status: DC

## 2011-12-11 MED ORDER — HYDROCODONE-ACETAMINOPHEN 5-500 MG PO TABS: 2.0000 | ORAL_TABLET | Freq: Four times a day (QID) | ORAL | Status: DC | PRN

## 2011-12-11 NOTE — Telephone Encounter (Signed)
The patient was given a Rx from Dr. Mariah Milling 12/04/2011 for vicodin for 14 tablets.  The patient states the pain in left side of sternum and left rib area is better with the vicodin.  He would like to have either the vicodin called again or something different. He does not have any pain during the day when up and moving the symptoms are mostly when lying down. Do you feel this is pericarditis? The patient was to follow up with Dr. Ermalene Searing today but appointment cancelled due to bad weather.  I told the patient to contact PCP. The patient is confused on who to call and just trying to figure out what his problem is. I told the patient symptoms per Dr. Mariah Milling do not seem cardiac but we would get back with him regarding his symptoms.

## 2011-12-11 NOTE — Telephone Encounter (Signed)
Rx's sent in per Dr. Mariah Milling.

## 2011-12-11 NOTE — Telephone Encounter (Signed)
Suggest: Refill vicodin for pain, #30 take 1 to 2 q 4 to 6 hrs PRN Follow up with PMD Could call in colchicine 0.6 mg take BID prn for possible pericarditis, take with ibuprofen See PT/chiropractic Could consider a CT scan of the chest if sx persist

## 2011-12-15 ENCOUNTER — Other Ambulatory Visit: Payer: Medicare Other

## 2011-12-15 DIAGNOSIS — R071 Chest pain on breathing: Secondary | ICD-10-CM | POA: Diagnosis not present

## 2011-12-18 ENCOUNTER — Encounter: Payer: Medicare Other | Admitting: Family Medicine

## 2011-12-25 ENCOUNTER — Encounter: Payer: Medicare Other | Admitting: Family Medicine

## 2011-12-30 DIAGNOSIS — Z85828 Personal history of other malignant neoplasm of skin: Secondary | ICD-10-CM | POA: Diagnosis not present

## 2011-12-30 DIAGNOSIS — L719 Rosacea, unspecified: Secondary | ICD-10-CM | POA: Diagnosis not present

## 2011-12-30 DIAGNOSIS — L570 Actinic keratosis: Secondary | ICD-10-CM | POA: Diagnosis not present

## 2011-12-31 DIAGNOSIS — R071 Chest pain on breathing: Secondary | ICD-10-CM | POA: Diagnosis not present

## 2012-01-06 ENCOUNTER — Encounter: Payer: Self-pay | Admitting: Family Medicine

## 2012-01-06 ENCOUNTER — Ambulatory Visit (INDEPENDENT_AMBULATORY_CARE_PROVIDER_SITE_OTHER): Payer: Medicare Other | Admitting: Family Medicine

## 2012-01-06 VITALS — BP 120/70 | HR 87 | Temp 98.7°F | Ht 66.0 in | Wt 192.8 lb

## 2012-01-06 DIAGNOSIS — R972 Elevated prostate specific antigen [PSA]: Secondary | ICD-10-CM | POA: Diagnosis not present

## 2012-01-06 DIAGNOSIS — N400 Enlarged prostate without lower urinary tract symptoms: Secondary | ICD-10-CM | POA: Diagnosis not present

## 2012-01-06 DIAGNOSIS — Z0000 Encounter for general adult medical examination without abnormal findings: Secondary | ICD-10-CM | POA: Diagnosis not present

## 2012-01-06 NOTE — Patient Instructions (Signed)
REFERRAL: GO THE THE FRONT ROOM AT THE ENTRANCE OF OUR CLINIC, NEAR CHECK IN. ASK FOR MARION. SHE WILL HELP YOU SET UP YOUR REFERRAL. DATE: TIME:  

## 2012-01-06 NOTE — Progress Notes (Signed)
Patient Name: [name] Date of Birth: August 18, 1946 Medical Record Number: [id number] Gender: male Date of Encounter: 01/06/2012  History of Present Illness:  [name] is a 65 y.o. very pleasant male patient who presents with the following:  Medicare wellness examination.  Preventative Health Maintenance Visit:  Health Maintenance Summary Reviewed and updated, unless pt declines services.  Tobacco History Reviewed. Alcohol: No concerns, no excessive use Exercise Habits: Some activity, rec at least 30 mins 5 times a week STD concerns: no risk or activity to increase risk Drug Use: None Encouraged self-testicular check  Health Maintenance  Topic Date Due  . Pneumococcal Polysaccharide Vaccine Age 66 And Over  09/23/2010  . Influenza Vaccine  08/16/2012  . Colonoscopy  09/16/2016  . Tetanus/tdap  11/29/2017  . Zostavax  Completed    Labs reviewed with the patient.   Lipids:    Component Value Date/Time   CHOL 203* 11/27/2011 0815   TRIG 95.0 11/27/2011 0815   HDL 48.00 11/27/2011 0815   LDLDIRECT 137.1 11/27/2011 0815   VLDL 19.0 11/27/2011 0815   CHOLHDL 4 11/27/2011 0815    CBC:    Component Value Date/Time   WBC 8.1 09/10/2008 1023   HGB 15.3 09/10/2008 1023   HCT 42.9 09/10/2008 1023   PLT 213 09/10/2008 1023   MCV 91.7 09/10/2008 1023   NEUTROABS 5.3 09/10/2008 1023   MONOABS 0.6 09/10/2008 1023   EOSABS 0.2 09/10/2008 1023   BASOSABS 0.0 09/10/2008 1023    Basic Metabolic Panel:    Component Value Date/Time   NA 140 11/27/2011 0815   K 4.7 11/27/2011 0815   CL 102 11/27/2011 0815   CO2 29 11/27/2011 0815   BUN 15 11/27/2011 0815   CREATININE 1.0 11/27/2011 0815   GLUCOSE 102* 11/27/2011 0815   CALCIUM 9.8 11/27/2011 0815    Lab Results  Component Value Date   ALT 52 11/27/2011   AST 36 11/27/2011   ALKPHOS 49 11/27/2011   BILITOT 1.1 11/27/2011    Lab Results  Component Value Date   PSA 5.45* 11/27/2011    Elevated PSA: I do not have a  baseline on this gentleman. He has noted he has had some worsening of his flow. No other complaints such as dysuria, penile discharge, or STD risk. No pain in his groin or in his testicular region.  Rare arm numbness CTS. Using splints and it resolved.   Lab Results  Component Value Date   PSA 5.45* 11/27/2011      Patient Active Problem List  Diagnoses  . NEOPLASM, SKIN, UNCERTAIN BEHAVIOR  . OBESITY  . ALLERGIC RHINITIS  . GERD  . ACTINIC KERATOSIS  . SKIN LESION, BENIGN  . ROTATOR CUFF SYNDROME  . FATIGUE, CHRONIC  . SYNCOPE, HX OF  . Chest pain  . High cholesterol  . Prediabetes  . Burning chest pain   Past Medical History  Diagnosis Date  . GERD (gastroesophageal reflux disease)   . Allergic rhinitis   . Esophageal stricture     EGD 11/07.  stricture dilated.  hiatal hernia, gastritis   No past surgical history on file. History  Substance Use Topics  . Smoking status: Never Smoker   . Smokeless tobacco: Not on file  . Alcohol Use: No   Family History  Problem Relation Age of Onset  . Hypertension Mother   . Stroke Father    No Known Allergies  Current Outpatient Prescriptions on File Prior to Visit  Medication Sig  Dispense Refill  . ALPHA LIPOIC ACID POWD Once daily       . cholecalciferol (VITAMIN D) 1000 UNITS tablet Take 1,000 Units by mouth daily.      Marland Kitchen HYDROcodone-acetaminophen (VICODIN) 5-500 MG per tablet Take 2 tablets by mouth every 6 (six) hours as needed for pain.  14 tablet  0  . Melatonin 1 MG CAPS As needed.       . Multiple Vitamins-Minerals (CENTRUM SILVER PO) Take one by mouth daily       . pantoprazole (PROTONIX) 40 MG tablet Take 1 tablet (40 mg total) by mouth 2 (two) times daily.  60 tablet  5    Medication list has been reviewed and updated.  Review of Systems:  General: Denies fever, chills, sweats. No significant weight loss. Eyes: Denies blurring,significant itching ENT: Denies earache, sore throat, and  hoarseness. Cardiovascular: Denies chest pains, palpitations, dyspnea on exertion Respiratory: Denies cough, dyspnea at rest,wheeezing Breast: no concerns about lumps GI: Denies nausea, vomiting, diarrhea, constipation, change in bowel habits, abdominal pain, melena, hematochezia GU: Denies penile discharge, ED, urinary flow / outflow problems. No STD concerns. Musculoskeletal: Denies back pain, joint pain Derm: Denies rash, itching Neuro: rare CTS sx Psych: Denies depression, anxiety Endocrine: Denies cold intolerance, heat intolerance, polydipsia Heme: Denies enlarged lymph nodes Allergy: No hayfever   Physical Examination: Filed Vitals:   01/06/12 0908  BP: 120/70  Pulse: 87  Temp: 98.7 F (37.1 C)  TempSrc: Oral  Height: 5\' 6"  (1.676 m)  Weight: 192 lb 12.8 oz (87.454 kg)  SpO2: 98%    Body mass index is 31.12 kg/(m^2).   Wt Readings from Last 3 Encounters:  01/06/12 192 lb 12.8 oz (87.454 kg)  12/02/11 196 lb (88.905 kg)  12/02/11 198 lb (89.812 kg)    GEN: well developed, well nourished, no acute distress Eyes: conjunctiva and lids normal, PERRLA, EOMI ENT: TM clear, nares clear, oral exam WNL Neck: supple, no lymphadenopathy, no thyromegaly, no JVD Pulm: clear to auscultation and percussion, respiratory effort normal CV: regular rate and rhythm, S1-S2, no murmur, rub or gallop, no bruits, peripheral pulses normal and symmetric, no cyanosis, clubbing, edema or varicosities Chest: no scars, masses GI: soft, non-tender; no hepatosplenomegaly, masses; active bowel sounds all quadrants GU: no hernia, testicular mass, penile discharge. Fullness of the prostate is noticed with some fullness in the central region. Lymph: no cervical, axillary or inguinal adenopathy MSK: gait normal, muscle tone and strength WNL, no joint swelling, effusions, discoloration, crepitus  SKIN: clear, good turgor, color WNL, no rashes, lesions, or ulcerations Neuro: normal mental status,  normal strength, sensation, and motion Psych: alert; oriented to person, place and time, normally interactive and not anxious or depressed in appearance.   Assessment and Plan:  1. Routine general medical examination at a health care facility    2. Elevated PSA  Ambulatory referral to Urology  3. Enlarged prostate      I have personally reviewed the Medicare Annual Wellness questionnaire and have noted 1. The patient's medical and social history 2. Their use of alcohol, tobacco or illicit drugs 3. Their current medications and supplements 4. The patient's functional ability including ADL's, fall risks, home safety risks and hearing or visual             impairment. 5. Diet and physical activities 6. Evidence for depression or mood disorders  The patients weight, height, BMI and visual acuity have been recorded in the chart I have made referrals, counseling  and provided education to the patient based review of the above and I have provided the pt with a written personalized care plan for preventive services.  I have provided the patient with a copy of your personalized plan for preventive services. Instructed to take the time to review along with their updated medication list.    Generally, patient is doing well.  The patient does have a elevated PSA greater than 5. He is not having any symptoms around his groin or testicular region. He also has a large prostate with some enlarged in the central region.  We discussed what this means and this could represent a potential prostate cancer. It also could be a falsely positive examination. He is having no symptoms, so I think it is most prudent to be conservative in this case. We're going to schedule a consult with urology.

## 2012-01-27 ENCOUNTER — Other Ambulatory Visit: Payer: Self-pay | Admitting: Dermatology

## 2012-01-27 DIAGNOSIS — L570 Actinic keratosis: Secondary | ICD-10-CM | POA: Diagnosis not present

## 2012-01-27 DIAGNOSIS — D485 Neoplasm of uncertain behavior of skin: Secondary | ICD-10-CM | POA: Diagnosis not present

## 2012-01-27 DIAGNOSIS — L905 Scar conditions and fibrosis of skin: Secondary | ICD-10-CM | POA: Diagnosis not present

## 2012-01-27 DIAGNOSIS — L988 Other specified disorders of the skin and subcutaneous tissue: Secondary | ICD-10-CM | POA: Diagnosis not present

## 2012-02-04 DIAGNOSIS — N401 Enlarged prostate with lower urinary tract symptoms: Secondary | ICD-10-CM | POA: Diagnosis not present

## 2012-02-04 DIAGNOSIS — R972 Elevated prostate specific antigen [PSA]: Secondary | ICD-10-CM | POA: Diagnosis not present

## 2012-03-03 DIAGNOSIS — R972 Elevated prostate specific antigen [PSA]: Secondary | ICD-10-CM | POA: Diagnosis not present

## 2012-03-10 DIAGNOSIS — R972 Elevated prostate specific antigen [PSA]: Secondary | ICD-10-CM | POA: Diagnosis not present

## 2012-03-10 DIAGNOSIS — N401 Enlarged prostate with lower urinary tract symptoms: Secondary | ICD-10-CM | POA: Diagnosis not present

## 2012-05-02 DIAGNOSIS — L570 Actinic keratosis: Secondary | ICD-10-CM | POA: Diagnosis not present

## 2012-05-02 DIAGNOSIS — L821 Other seborrheic keratosis: Secondary | ICD-10-CM | POA: Diagnosis not present

## 2012-08-15 ENCOUNTER — Encounter: Payer: Self-pay | Admitting: Internal Medicine

## 2012-09-07 DIAGNOSIS — R972 Elevated prostate specific antigen [PSA]: Secondary | ICD-10-CM | POA: Diagnosis not present

## 2012-09-14 DIAGNOSIS — R972 Elevated prostate specific antigen [PSA]: Secondary | ICD-10-CM | POA: Diagnosis not present

## 2012-09-14 DIAGNOSIS — N401 Enlarged prostate with lower urinary tract symptoms: Secondary | ICD-10-CM | POA: Diagnosis not present

## 2012-09-17 DIAGNOSIS — Z23 Encounter for immunization: Secondary | ICD-10-CM | POA: Diagnosis not present

## 2012-11-04 ENCOUNTER — Encounter: Payer: Self-pay | Admitting: Cardiovascular Disease

## 2012-11-04 ENCOUNTER — Ambulatory Visit (INDEPENDENT_AMBULATORY_CARE_PROVIDER_SITE_OTHER): Payer: Medicare Other | Admitting: Cardiovascular Disease

## 2012-11-04 ENCOUNTER — Telehealth: Payer: Self-pay | Admitting: Cardiovascular Disease

## 2012-11-04 VITALS — BP 120/64 | HR 75 | Ht 66.0 in | Wt 192.0 lb

## 2012-11-04 VITALS — BP 120/62 | HR 75 | Ht 66.0 in | Wt 192.0 lb

## 2012-11-04 DIAGNOSIS — R079 Chest pain, unspecified: Secondary | ICD-10-CM

## 2012-11-04 DIAGNOSIS — R011 Cardiac murmur, unspecified: Secondary | ICD-10-CM

## 2012-11-04 HISTORY — DX: Cardiac murmur, unspecified: R01.1

## 2012-11-04 NOTE — Patient Instructions (Addendum)
Normal stress test

## 2012-11-04 NOTE — Patient Instructions (Addendum)
Your physician has requested that you have an exercise tolerance test. For further information please visit [email or web address]. Please also follow instruction sheet, as given.  Your physician has requested that you have an echocardiogram. Echocardiography is a painless test that uses sound waves to create images of your heart. It provides your doctor with information about the size and shape of your heart and how well your heart's chambers and valves are working. This procedure takes approximately one hour. There are no restrictions for this procedure.  Follow up as needed  

## 2012-11-04 NOTE — Progress Notes (Signed)
HPI  This is a pleasant 66 year old male who is her today for urgent evaluation of chest pain. The patient was seen by Dr.Gollan in January of this year for pleuritic chest pain that was highly suggestive of pericarditis. He was treated at that time with ibuprofen with resolution of his symptoms. He has no significant risk factors for coronary artery disease except age and gender. Yesterday, while he was sitting he had chest discomfort in the upper chest area described as a dull aching sensation with no radiation. It lasted for a few hours. His blood pressure was checked by a friend who is a nurse and was found to be elevated at 170/100. The patient was anxious at that time. He has no history of hypertension. He had slight dyspnea. He did not have any heartburn. The patient did move heavy furniture recently. He also felt some discomfort after he carried a Christmas tree.  No Known Allergies   Current Outpatient Prescriptions on File Prior to Visit  Medication Sig Dispense Refill  . ALPHA LIPOIC ACID POWD Once daily       . cholecalciferol (VITAMIN D) 1000 UNITS tablet Take 1,000 Units by mouth daily.      . Melatonin 1 MG CAPS As needed.       . Multiple Vitamins-Minerals (CENTRUM SILVER PO) Take one by mouth daily       . pantoprazole (PROTONIX) 40 MG tablet Take 1 tablet (40 mg total) by mouth 2 (two) times daily.  60 tablet  5     Past Medical History  Diagnosis Date  . GERD (gastroesophageal reflux disease)   . Allergic rhinitis   . Esophageal stricture     EGD 11/07.  stricture dilated.  hiatal hernia, gastritis     History reviewed. No pertinent past surgical history.   Family History  Problem Relation Age of Onset  . Hypertension Mother   . Stroke Father      History   Social History  . Marital Status: Married    Spouse Name: N/A    Number of Children: 2  . Years of Education: N/A   Occupational History  . Attorney    Social History Main Topics  . Smoking  status: Never Smoker   . Smokeless tobacco: Not on file  . Alcohol Use: No  . Drug Use: No  . Sexually Active: Not on file   Other Topics Concern  . Not on file   Social History Narrative   Regular exercise- walks 3-4 times per week, lifts weights, bicycling 10 miles every other day.Diet- 3 meals , lots of fluids, stopped Coke, changed to green tea.     ROS Constitutional: Negative for fever, chills, diaphoresis, activity change, appetite change and fatigue.  HENT: Negative for hearing loss, nosebleeds, congestion, sore throat, facial swelling, drooling, trouble swallowing, neck pain, voice change, sinus pressure and tinnitus.  Eyes: Negative for photophobia, pain, discharge and visual disturbance.  Respiratory: Negative for apnea, cough and wheezing.  Cardiovascular: Negative for  palpitations and leg swelling.  Gastrointestinal: Negative for nausea, vomiting, abdominal pain, diarrhea, constipation, blood in stool and abdominal distention.  Genitourinary: Negative for dysuria, urgency, frequency, hematuria and decreased urine volume.  Musculoskeletal: Negative for myalgias, back pain, joint swelling, arthralgias and gait problem.  Skin: Negative for color change, pallor, rash and wound.  Neurological: Negative for dizziness, tremors, seizures, syncope, speech difficulty, weakness, light-headedness, numbness and headaches.  Psychiatric/Behavioral: Negative for suicidal ideas, hallucinations, behavioral problems and agitation. The  patient is nervous/anxious.     PHYSICAL EXAM   BP 120/64  Pulse 75  Ht 5\' 6"  (1.676 m)  Wt 192 lb (87.091 kg)  BMI 30.99 kg/m2 Constitutional: He is oriented to person, place, and time. He appears well-developed and well-nourished. No distress.  HENT: No nasal discharge.  Head: Normocephalic and atraumatic.  Eyes: Pupils are equal and round. Right eye exhibits no discharge. Left eye exhibits no discharge.  Neck: Normal range of motion. Neck supple.  No JVD present. No thyromegaly present.  Cardiovascular: Heart sounds are distant. Normal rate, regular rhythm, normal heart sounds and. Exam reveals no gallop and no friction rub. There is a 1/6 systolic ejection murmur at the aortic area. He is slightly tender in the upper chest area. Pulmonary/Chest: Effort normal and breath sounds normal. No stridor. No respiratory distress. He has no wheezes. He has no rales. He exhibits no tenderness.  Abdominal: Soft. Bowel sounds are normal. He exhibits no distension. There is no tenderness. There is no rebound and no guarding.  Musculoskeletal: Normal range of motion. He exhibits no edema and no tenderness.  Neurological: He is alert and oriented to person, place, and time. Coordination normal.  Skin: Skin is warm and dry. No rash noted. He is not diaphoretic. No erythema. No pallor.  Psychiatric: He has a normal mood and affect. His behavior is normal. Judgment and thought content normal.       ZOX:[id number]  Rhythm  WITHIN NORMAL LIMITS   ASSESSMENT AND PLAN

## 2012-11-04 NOTE — Telephone Encounter (Signed)
Pt had some chest discomfort last night. Went to a friends house who is a Engineer, civil (consulting) and she said that his BP was up and that he needed to see and dr TODAY. 170/95 pt had a chest strain earlier this year and percarditis. Pt sit for a few minutes and it came down pt went home and took ibuprofen and felt better.

## 2012-11-04 NOTE — Telephone Encounter (Signed)
Pt's wife is concerned. She says pt was seen by Dr. Mariah Milling in January and was dx with costochondritis and pericarditis. HE was told to take ibuprofen and f/u in 1 month. The pt has not been seen since then. Wife says he continues to have "twinges" of CP intermittently and then last night, he was c/o substernal cp. She took him to a friend's house (who is a Engineer, civil (consulting)) and his BP was 170/95 then 165/80. The nurse friend suggested he f/u with [place] today Pt has taken protonix and ibuprofen with some relief He has a family hx of CVA and possibly CAD I explained Dr. Mariah Milling not in office today but Dr. Kirke Corin had an opening today at 2:45. Wife is ok with this and will bring pt in today I encouraged her to have pt go to ER should pain get worse or change before appt Understanding verb

## 2012-11-04 NOTE — Assessment & Plan Note (Signed)
Chest pain is overall atypical and I suspect is musculoskeletal in etiology related to heavy lifting recently. Baseline ECG is normal. His blood pressure was elevated yesterday but is normal today. Risk factors for coronary artery disease include age and gender. I decided to proceed with a treadmill stress test for risk stratification which overall was normal with no evidence of ischemia. Patient was reassured. I asked him to followup with [place] as needed if chest pain does not resolve.

## 2012-11-04 NOTE — Assessment & Plan Note (Signed)
The patient has a faint cardiac murmur in the aortic area with overall distant heart sounds. I will obtain an echocardiogram for evaluation.

## 2012-11-04 NOTE — Procedures (Signed)
    Treadmill Stress test  Indication: Chest pain.  Baseline Data:  Resting EKG shows NSR with rate of 72 bpm, no significant ST or T wave changes Resting blood pressure of 130/72 mm Hg Stand bruce protocal was used.  Exercise Data:  Patient exercised for 9 min 0 sec,  Peak heart rate of 129 bpm.  This was 84 % of the maximum predicted heart rate. No symptoms of chest pain or lightheadedness were reported at peak stress or in recovery.  Peak Blood pressure recorded was 162/68 Maximal work level: 10.1 METs.  Heart rate at 3 minutes in recovery was 77 bpm. BP response: Normal HR response: Normal  EKG with Exercise: Sinus tachycardia with no significant ST changes  FINAL IMPRESSION: Normal exercise stress test. No significant EKG changes concerning for ischemia. Good exercise tolerance.

## 2012-11-29 ENCOUNTER — Other Ambulatory Visit (INDEPENDENT_AMBULATORY_CARE_PROVIDER_SITE_OTHER): Payer: Medicare Other

## 2012-11-29 ENCOUNTER — Other Ambulatory Visit: Payer: Self-pay

## 2012-11-29 DIAGNOSIS — R011 Cardiac murmur, unspecified: Secondary | ICD-10-CM | POA: Diagnosis not present

## 2013-01-03 ENCOUNTER — Telehealth: Payer: Self-pay | Admitting: Internal Medicine

## 2013-01-03 NOTE — Telephone Encounter (Signed)
Pts wife requesting sooner appt for pt. Pt is having difficulty swallowing again. Pt scheduled to see Dr. Marina Goodell tomorrow at 8:45am. Pts wife aware of appt date and time.

## 2013-01-04 ENCOUNTER — Ambulatory Visit (INDEPENDENT_AMBULATORY_CARE_PROVIDER_SITE_OTHER): Payer: Medicare Other | Admitting: Internal Medicine

## 2013-01-04 ENCOUNTER — Encounter: Payer: Self-pay | Admitting: Internal Medicine

## 2013-01-04 VITALS — BP 132/60 | HR 90 | Ht 66.0 in | Wt 191.0 lb

## 2013-01-04 DIAGNOSIS — K219 Gastro-esophageal reflux disease without esophagitis: Secondary | ICD-10-CM

## 2013-01-04 MED ORDER — MOVIPREP 100 G PO SOLR: 1.0000 | Freq: Once | ORAL | Status: DC

## 2013-01-04 NOTE — Progress Notes (Signed)
HISTORY OF PRESENT ILLNESS:  [name] is a 67 y.o. male with no significant past medical history who was last seen in October 2007 regarding abdominal pain and dysphagia. See that dictation. He subsequently underwent colonoscopy for the purposes of screening as well as upper endoscopy. The examinations were performed November 2007. Colonoscopy revealed mild sigmoid diverticulosis as well as several diminutive colon polyps which were removed and found to be adenomatous. Followup in 5 years recommended. He apparently received a recall letter but did not act on it. Upper endoscopy revealed a peptic stricture of the distal esophagus which was dilated with a 54 [place] Maloney dilator. He was instructed to continue on omeprazole 20 mg daily indefinitely. Patient tells me that he has tried to come off omeprazole on multiple occasions. When he does this he has significant reflux symptoms as well as recurrent dysphagia with discomfort swallowing. On medication, symptoms improved. He has been experiencing difficulties with swallowing recently. No other GI complaints. No interval family history of colon cancer.  REVIEW OF SYSTEMS:  All non-GI ROS negative after complete review   Past Medical History  Diagnosis Date  . GERD (gastroesophageal reflux disease)   . Allergic rhinitis   . Esophageal stricture     EGD 11/07.  stricture dilated.  hiatal hernia, gastritis    Past Surgical History  Procedure Laterality Date  . Skin cancer excision      Social History [name]  reports that he has never smoked. He has never used smokeless tobacco. He reports that he does not drink alcohol or use illicit drugs.  family history includes Hypertension in his mother and Stroke in his father.  There is no history of Colon cancer.  No Known Allergies     PHYSICAL EXAMINATION: Vital signs: BP 132/60  Pulse 90  Ht 5\' 6"  (1.676 m)  Wt 191 lb (86.637 kg)  BMI 30.84 kg/m2  Constitutional: generally  well-appearing, no acute distress Psychiatric: alert and oriented x3, cooperative Eyes: extraocular movements intact, anicteric, conjunctiva pink Mouth: oral pharynx moist, no lesions Neck: supple no lymphadenopathy Cardiovascular: heart regular rate and rhythm, no murmur Lungs: clear to auscultation bilaterally Abdomen: soft, nontender, nondistended, no obvious ascites, no peritoneal signs, normal bowel sounds, no organomegaly Rectal: Deferred until colonoscopy Extremities: no lower extremity edema bilaterally Skin: no lesions on visible extremities Neuro: No focal deficits. No asterixis.    ASSESSMENT:  #1. GERD complicated by peptic stricture. Significant recurrent symptoms off medication. Now with some residual dysphagia 2. History of adenomatous colon polyps 2007. Overdue for followup   PLAN:  #1. Reflux precautions #2. Continue omeprazole 20 mg daily #3. Surveillance colonoscopy.The nature of the procedure, as well as the risks, benefits, and alternatives were carefully and thoroughly reviewed with the patient. Ample time for discussion and questions allowed. The patient understood, was satisfied, and agreed to proceed. #4. Movi prep prescribed. The patient instructed on its use #5. Upper endoscopy with esophageal dilation.The nature of the procedure, as well as the risks, benefits, and alternatives were carefully and thoroughly reviewed with the patient. Ample time for discussion and questions allowed. The patient understood, was satisfied, and agreed to proceed.

## 2013-01-04 NOTE — Patient Instructions (Addendum)
You have been scheduled for an endoscopy with dilation and colonoscopy with propofol. Please follow the written instructions given to you at your visit today. Please pick up your prep at the pharmacy within the next 1-3 days. If you use inhalers (even only as needed) or a CPAP machine, please bring them with you on the day of your procedure.

## 2013-01-16 ENCOUNTER — Ambulatory Visit (AMBULATORY_SURGERY_CENTER): Payer: Medicare Other | Admitting: Internal Medicine

## 2013-01-16 ENCOUNTER — Encounter: Payer: Self-pay | Admitting: Internal Medicine

## 2013-01-16 VITALS — BP 125/75 | HR 56 | Temp 98.9°F | Resp 18 | Ht 66.0 in | Wt 191.0 lb

## 2013-01-16 DIAGNOSIS — D126 Benign neoplasm of colon, unspecified: Secondary | ICD-10-CM

## 2013-01-16 DIAGNOSIS — Z86010 Personal history of colonic polyps: Secondary | ICD-10-CM | POA: Diagnosis not present

## 2013-01-16 DIAGNOSIS — K222 Esophageal obstruction: Secondary | ICD-10-CM | POA: Diagnosis not present

## 2013-01-16 DIAGNOSIS — K219 Gastro-esophageal reflux disease without esophagitis: Secondary | ICD-10-CM | POA: Diagnosis not present

## 2013-01-16 MED ORDER — SODIUM CHLORIDE 0.9 % IV SOLN: 500.0000 mL | INTRAVENOUS | Status: DC

## 2013-01-16 NOTE — Patient Instructions (Addendum)
Handouts were given to your care partner on polyps, diverticulosis, high fiber diet and dilatation diet.  You may resume your current medications today. Per Dr. Marina Goodell please continue the omeprazole medication.  Please call if any questions or concerns.    YOU HAD AN ENDOSCOPIC PROCEDURE TODAY AT THE [facility]: Refer to the procedure report that was given to you for any specific questions about what was found during the examination.  If the procedure report does not answer your questions, please call your gastroenterologist to clarify.  If you requested that your care partner not be given the details of your procedure findings, then the procedure report has been included in a sealed envelope for you to review at your convenience later.  YOU SHOULD EXPECT: Some feelings of bloating in the abdomen. Passage of more gas than usual.  Walking can help get rid of the air that was put into your GI tract during the procedure and reduce the bloating. If you had a lower endoscopy (such as a colonoscopy or flexible sigmoidoscopy) you may notice spotting of blood in your stool or on the toilet paper. If you underwent a bowel prep for your procedure, then you may not have a normal bowel movement for a few days.  DIET:   Drink plenty of fluids but you should avoid alcoholic beverages for 24 hours.  Please follow the dilatation diet the rest of the day.  ACTIVITY: Your care partner should take you home directly after the procedure.  You should plan to take it easy, moving slowly for the rest of the day.  You can resume normal activity the day after the procedure however you should NOT DRIVE or use heavy machinery for 24 hours (because of the sedation medicines used during the test).    SYMPTOMS TO REPORT IMMEDIATELY: A gastroenterologist can be reached at any hour.  During normal business hours, 8:30 AM to 5:00 PM Monday through Friday, call [phone].  After hours and on weekends, please call the  GI answering service at [phone] who will take a message and have the physician on call contact you.   Following lower endoscopy (colonoscopy or flexible sigmoidoscopy):  Excessive amounts of blood in the stool  Significant tenderness or worsening of abdominal pains  Swelling of the abdomen that is new, acute  Fever of 100F or higher  Following upper endoscopy (EGD)  Vomiting of blood or coffee ground material  New chest pain or pain under the shoulder blades  Painful or persistently difficult swallowing  New shortness of breath  Fever of 100F or higher  Black, tarry-looking stools  FOLLOW UP: If any biopsies were taken you will be contacted by phone or by letter within the next 1-3 weeks.  Call your gastroenterologist if you have not heard about the biopsies in 3 weeks.  Our staff will call the home number listed on your records the next business day following your procedure to check on you and address any questions or concerns that you may have at that time regarding the information given to you following your procedure. This is a courtesy call and so if there is no answer at the home number and we have not heard from you through the emergency physician on call, we will assume that you have returned to your regular daily activities without incident.  SIGNATURES/CONFIDENTIALITY: You and/or your care partner have signed paperwork which will be entered into your electronic medical record.  These signatures attest to the fact  that that the information above on your After Visit Summary has been reviewed and is understood.  Full responsibility of the confidentiality of this discharge information lies with you and/or your care-partner.

## 2013-01-16 NOTE — Progress Notes (Signed)
Patient did not experience any of the following events: a burn prior to discharge; a fall within the facility; wrong site/side/patient/procedure/implant event; or a hospital transfer or hospital admission upon discharge from the facility. (G8907)Patient did not have preoperative order for IV antibiotic SSI prophylaxis. (G8918)    No complaints noted in the recovery room. Maw   

## 2013-01-16 NOTE — Op Note (Signed)
[facility] [address] [place], [address]   COLONOSCOPY PROCEDURE REPORT  PATIENT: [name], [name]  MR#: [id number] BIRTHDATE: Aug 15, 1946 , 66  yrs. old GENDER: Male ENDOSCOPIST: Roxy Cedar, MD REFERRED AO:[id number] Program Recall PROCEDURE DATE:  01/16/2013 PROCEDURE:   Colonoscopy with snare polypectomy    x 1 ASA CLASS:   Class II INDICATIONS:Patient's personal history of adenomatous colon polyps.Index 09-2006 (4 small adenomas) MEDICATIONS: MAC sedation, administered by CRNA and propofol (Diprivan) 200mg  IV  DESCRIPTION OF PROCEDURE:   After the risks benefits and alternatives of the procedure were thoroughly explained, informed consent was obtained.  A digital rectal exam revealed no abnormalities of the rectum.   The LB CF-H180AL [id number]  endoscope was introduced through the anus and advanced to the cecum, which was identified by both the appendix and ileocecal valve. No adverse events experienced.   The quality of the prep was excellent, using MoviPrep  The instrument was then slowly withdrawn as the colon was fully examined.      COLON FINDINGS: A diminutive polyp was found in the sigmoid colon. A polypectomy was performed with a cold snare.  The resection was complete and the polyp tissue was completely retrieved.   Mild diverticulosis was noted in the sigmoid colon.   The colon mucosa was otherwise normal.  Retroflexed views revealed no abnormalities. The time to cecum=2 minutes 0 seconds.  Withdrawal time=9 minutes 16 seconds.  The scope was withdrawn and the procedure completed. COMPLICATIONS: There were no complications.  ENDOSCOPIC IMPRESSION: 1.   Diminutive polyp was found in the sigmoid colon; polypectomy was performed with a cold snare 2.   Mild diverticulosis was noted in the sigmoid colon 3.   The colon mucosa was otherwise normal  RECOMMENDATIONS: 1.  Follow up colonoscopy in 5 years 2.  Upper endoscopy today (see  report)   eSigned:  Roxy Cedar, MD 01/16/2013 2:31 PM   cc: Excell Seltzer, MD and The Patient   PATIENT NAME:  [name], [name] MR#: [id number]

## 2013-01-16 NOTE — Op Note (Signed)
[facility] [address] [place], [address]   ENDOSCOPY PROCEDURE REPORT  PATIENT: [name], [name]  MR#: [id number] BIRTHDATE: August 11, 1946 , 66  yrs. old GENDER: Male ENDOSCOPIST: Roxy Cedar, MD REFERRED BY:  .  Self / Office PROCEDURE DATE:  01/16/2013 PROCEDURE:  EGD, diagnostic and Maloney dilation of esophagus  - 53F ASA CLASS:     Class II INDICATIONS:  History of esophageal reflux.   Dysphagia. Therapeutic procedure.Marland Kitchen MEDICATIONS: MAC sedation, administered by CRNA and propofol (Diprivan) 250mg  IV TOPICAL ANESTHETIC: none  DESCRIPTION OF PROCEDURE: After the risks benefits and alternatives of the procedure were thoroughly explained, informed consent was obtained.  The LB GIF-H180 [id number] endoscope was introduced through the mouth and advanced to the second portion of the duodenum. Without limitations.  The instrument was slowly withdrawn as the mucosa was fully examined.      EXAM:Ring-like 15mm stricture at GEJ.  Normal stomach and duodenum. Retroflexed views revealed a hiatal hernia.     The scope was then withdrawn from the patient and the procedure completed.  THERAPY: 53 F MALONEY DILATOR PASSED WITH MINIMAL RESISTANCE AND NO HEME. TOLERATED WELL  COMPLICATIONS: There were no complications.  ENDOSCOPIC IMPRESSION: 1. Ring-like 15mm stricture at GEJ. S/P dilation 2. Otherwisr Normal stomach and duodenum.  RECOMMENDATIONS: 1.  Clear liquids until 5 pm , then soft foods rest of day.  Resume prior diet tomorrow. 2.  Continue PPI therapy (omeprazole) 3.  Follow clinical response to dilatation.  REPEAT EXAM:  eSigned:  Roxy Cedar, MD 01/16/2013 2:43 PM  CC:Amy Michelle Nasuti, MD and The Patient

## 2013-01-16 NOTE — Progress Notes (Signed)
Called to room to assist during endoscopic procedure.  Patient ID and intended procedure confirmed with present staff. Received instructions for my participation in the procedure from the performing physician.  

## 2013-01-17 ENCOUNTER — Telehealth: Payer: Self-pay | Admitting: *Deleted

## 2013-01-17 NOTE — Telephone Encounter (Signed)
  Follow up Call-  Call back number 01/16/2013  Post procedure Call Back phone  # [phone] home #  Permission to leave phone message Yes     Patient questions:  Do you have a fever, pain , or abdominal swelling? no Pain Score  0 *  Have you tolerated food without any problems? yes  Have you been able to return to your normal activities? yes  Do you have any questions about your discharge instructions: Diet   no Medications  no Follow up visit  no  Do you have questions or concerns about your Care? no  Actions: * If pain score is 4 or above: No action needed, pain <4.

## 2013-01-23 ENCOUNTER — Encounter: Payer: Self-pay | Admitting: Internal Medicine

## 2013-01-24 ENCOUNTER — Ambulatory Visit: Payer: Medicare Other | Admitting: Internal Medicine

## 2013-03-28 DIAGNOSIS — M25569 Pain in unspecified knee: Secondary | ICD-10-CM | POA: Diagnosis not present

## 2013-03-28 DIAGNOSIS — M6580 Other synovitis and tenosynovitis, unspecified site: Secondary | ICD-10-CM | POA: Diagnosis not present

## 2013-04-18 DIAGNOSIS — M25569 Pain in unspecified knee: Secondary | ICD-10-CM | POA: Diagnosis not present

## 2013-04-18 DIAGNOSIS — M6580 Other synovitis and tenosynovitis, unspecified site: Secondary | ICD-10-CM | POA: Diagnosis not present

## 2013-04-27 ENCOUNTER — Ambulatory Visit: Payer: Medicare Other | Admitting: Cardiovascular Disease

## 2013-05-08 DIAGNOSIS — T6391XA Toxic effect of contact with unspecified venomous animal, accidental (unintentional), initial encounter: Secondary | ICD-10-CM | POA: Diagnosis not present

## 2013-06-13 ENCOUNTER — Ambulatory Visit (INDEPENDENT_AMBULATORY_CARE_PROVIDER_SITE_OTHER): Payer: Medicare Other | Admitting: Cardiovascular Disease

## 2013-06-13 ENCOUNTER — Encounter: Payer: Self-pay | Admitting: Cardiovascular Disease

## 2013-06-13 VITALS — BP 130/68 | HR 73 | Ht 66.5 in | Wt 188.5 lb

## 2013-06-13 DIAGNOSIS — R079 Chest pain, unspecified: Secondary | ICD-10-CM

## 2013-06-13 DIAGNOSIS — E785 Hyperlipidemia, unspecified: Secondary | ICD-10-CM

## 2013-06-13 HISTORY — DX: Hyperlipidemia, unspecified: E78.5

## 2013-06-13 NOTE — Patient Instructions (Addendum)
You are doing well. If chest pain presents again, try aleve/naproxen (or ibuprofen) as needed  Try Red Yeast Rice 1 to 4 a day for cholesterol  Please call us if you have new issues that need to be addressed before your next appt.  Your physician wants you to follow-up in: 12 months.  You will receive a reminder letter in the mail two months in advance. If you don't receive a letter, please call our office to schedule the follow-up appointment.

## 2013-06-13 NOTE — Progress Notes (Signed)
Patient ID: [name], male    DOB: [birth year], 67 y.o.   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  Previous chest burning may have occurred after using his dumbbells. He presents today and reports that he was opening and closing his compost heap can. He developed burning chest pain similar to previous symptoms. He has been lifting his dumbbells recently as well. He has tried ibuprofen with improvement of his symptoms and he presents today to make sure that everything is okay.  He is uncertain whether to do a long taper on his ibuprofen.  Prior to this, he was very active  and has not had any symptoms of chest discomfort with exertion.  EKG shows normal sinus rhythm with rate 73 beats per minute with no significant ST or T wave changes   Outpatient Encounter Prescriptions as of 06/13/2013  Medication Sig Dispense Refill  . ALPHA LIPOIC ACID POWD Once daily       . cholecalciferol (VITAMIN D) 1000 UNITS tablet Take 1,000 Units by mouth daily.      . Cyanocobalamin (VITAMIN B-12 CR PO) Take 1 tablet by mouth daily.      Marland Kitchen FOLIC ACID PO Take 1 tablet by mouth daily.      Marland Kitchen ibuprofen (ADVIL,MOTRIN) 600 MG tablet Take 600 mg by mouth 3 (three) times daily.       . Multiple Vitamins-Minerals (CENTRUM SILVER PO) Take one by mouth daily       . omeprazole (PRILOSEC OTC) 20 MG tablet Take 20 mg by mouth daily.        Review of Systems  Constitutional: Negative.   HENT: Negative.   Eyes: Negative.   Respiratory: Negative.   Cardiovascular: Positive for chest pain.        reproducible chest pain with palpation on the left side of his mediastinum  Gastrointestinal: Negative.    Musculoskeletal: Negative.   Skin: Negative.   Neurological: Negative.   Psychiatric/Behavioral: Negative.   All other systems reviewed and are negative.    BP 130/68  Pulse 73  Ht 5' 6.5" (1.689 m)  Wt 188 lb 8 oz (85.503 kg)  BMI 29.97 kg/m2   Physical Exam  Nursing note and vitals reviewed. Constitutional: He is oriented to person, place, and time. He appears well-developed and well-nourished.  HENT:  Head: Normocephalic.  Nose: Nose normal.  Mouth/Throat: Oropharynx is clear and moist.  Eyes: Conjunctivae are normal. Pupils are equal, round, and reactive to light.  Neck: Normal range of motion. Neck supple. No JVD present.  Cardiovascular: Normal rate, regular rhythm, S1 normal, S2 normal, normal heart sounds and intact distal pulses.  Exam reveals no gallop and no friction rub.   No murmur heard. Pulmonary/Chest: Effort normal and breath sounds normal. No respiratory distress. He has no wheezes. He has no rales. He exhibits no tenderness.  Abdominal: Soft. Bowel sounds are normal. He exhibits no distension. There is no tenderness.  Musculoskeletal: Normal range of motion. He exhibits no edema and no tenderness.  Chest wall tenderness on the left side of the mediastinum  Lymphadenopathy:    He has no cervical adenopathy.  Neurological: He is alert and oriented to person, place, and  time. Coordination normal.  Skin: Skin is warm and dry. No rash noted. No erythema.  Psychiatric: He has a normal mood and affect. His behavior is normal. Judgment and thought content normal.      Assessment and Plan

## 2013-06-13 NOTE — Assessment & Plan Note (Signed)
Encouraged him to try red yeast rice as he does like over-the-counter vitamins. If cholesterol continues to run high, could try a generic cholesterol medication

## 2013-06-13 NOTE — Assessment & Plan Note (Signed)
Etiology is suggestive of musculoskeletal source. Recent lifting of his dumbbells, lifting his compost can been with symptoms. Symptoms relieved with ibuprofen. Encouraged him to use ibuprofen or naproxen as needed for symptoms. Do not think he will need a long taper. Encouraged him to stay away from heavy weightlifting.

## 2013-06-29 DIAGNOSIS — M5020 Other cervical disc displacement, unspecified cervical region: Secondary | ICD-10-CM | POA: Diagnosis not present

## 2013-06-29 DIAGNOSIS — M5124 Other intervertebral disc displacement, thoracic region: Secondary | ICD-10-CM | POA: Diagnosis not present

## 2013-06-29 DIAGNOSIS — M999 Biomechanical lesion, unspecified: Secondary | ICD-10-CM | POA: Diagnosis not present

## 2013-06-29 DIAGNOSIS — M9981 Other biomechanical lesions of cervical region: Secondary | ICD-10-CM | POA: Diagnosis not present

## 2013-06-30 DIAGNOSIS — R209 Unspecified disturbances of skin sensation: Secondary | ICD-10-CM | POA: Diagnosis not present

## 2013-06-30 DIAGNOSIS — M542 Cervicalgia: Secondary | ICD-10-CM | POA: Diagnosis not present

## 2013-07-03 DIAGNOSIS — M999 Biomechanical lesion, unspecified: Secondary | ICD-10-CM | POA: Diagnosis not present

## 2013-07-03 DIAGNOSIS — M5020 Other cervical disc displacement, unspecified cervical region: Secondary | ICD-10-CM | POA: Diagnosis not present

## 2013-07-03 DIAGNOSIS — M5124 Other intervertebral disc displacement, thoracic region: Secondary | ICD-10-CM | POA: Diagnosis not present

## 2013-07-03 DIAGNOSIS — M9981 Other biomechanical lesions of cervical region: Secondary | ICD-10-CM | POA: Diagnosis not present

## 2013-07-03 DIAGNOSIS — M5030 Other cervical disc degeneration, unspecified cervical region: Secondary | ICD-10-CM | POA: Diagnosis not present

## 2013-07-04 DIAGNOSIS — M9981 Other biomechanical lesions of cervical region: Secondary | ICD-10-CM | POA: Diagnosis not present

## 2013-07-04 DIAGNOSIS — M999 Biomechanical lesion, unspecified: Secondary | ICD-10-CM | POA: Diagnosis not present

## 2013-07-04 DIAGNOSIS — M5030 Other cervical disc degeneration, unspecified cervical region: Secondary | ICD-10-CM | POA: Diagnosis not present

## 2013-07-04 DIAGNOSIS — M5020 Other cervical disc displacement, unspecified cervical region: Secondary | ICD-10-CM | POA: Diagnosis not present

## 2013-07-04 DIAGNOSIS — M5124 Other intervertebral disc displacement, thoracic region: Secondary | ICD-10-CM | POA: Diagnosis not present

## 2013-07-06 DIAGNOSIS — M5020 Other cervical disc displacement, unspecified cervical region: Secondary | ICD-10-CM | POA: Diagnosis not present

## 2013-07-06 DIAGNOSIS — M5030 Other cervical disc degeneration, unspecified cervical region: Secondary | ICD-10-CM | POA: Diagnosis not present

## 2013-07-06 DIAGNOSIS — M5124 Other intervertebral disc displacement, thoracic region: Secondary | ICD-10-CM | POA: Diagnosis not present

## 2013-07-06 DIAGNOSIS — M999 Biomechanical lesion, unspecified: Secondary | ICD-10-CM | POA: Diagnosis not present

## 2013-07-06 DIAGNOSIS — M9981 Other biomechanical lesions of cervical region: Secondary | ICD-10-CM | POA: Diagnosis not present

## 2013-07-10 DIAGNOSIS — M9981 Other biomechanical lesions of cervical region: Secondary | ICD-10-CM | POA: Diagnosis not present

## 2013-07-10 DIAGNOSIS — M5020 Other cervical disc displacement, unspecified cervical region: Secondary | ICD-10-CM | POA: Diagnosis not present

## 2013-07-10 DIAGNOSIS — M999 Biomechanical lesion, unspecified: Secondary | ICD-10-CM | POA: Diagnosis not present

## 2013-07-10 DIAGNOSIS — M5030 Other cervical disc degeneration, unspecified cervical region: Secondary | ICD-10-CM | POA: Diagnosis not present

## 2013-07-10 DIAGNOSIS — M5124 Other intervertebral disc displacement, thoracic region: Secondary | ICD-10-CM | POA: Diagnosis not present

## 2013-07-11 DIAGNOSIS — M5020 Other cervical disc displacement, unspecified cervical region: Secondary | ICD-10-CM | POA: Diagnosis not present

## 2013-07-11 DIAGNOSIS — M9981 Other biomechanical lesions of cervical region: Secondary | ICD-10-CM | POA: Diagnosis not present

## 2013-07-11 DIAGNOSIS — M999 Biomechanical lesion, unspecified: Secondary | ICD-10-CM | POA: Diagnosis not present

## 2013-07-11 DIAGNOSIS — M5030 Other cervical disc degeneration, unspecified cervical region: Secondary | ICD-10-CM | POA: Diagnosis not present

## 2013-07-11 DIAGNOSIS — M5124 Other intervertebral disc displacement, thoracic region: Secondary | ICD-10-CM | POA: Diagnosis not present

## 2013-07-13 DIAGNOSIS — M999 Biomechanical lesion, unspecified: Secondary | ICD-10-CM | POA: Diagnosis not present

## 2013-07-13 DIAGNOSIS — M5020 Other cervical disc displacement, unspecified cervical region: Secondary | ICD-10-CM | POA: Diagnosis not present

## 2013-07-13 DIAGNOSIS — M5030 Other cervical disc degeneration, unspecified cervical region: Secondary | ICD-10-CM | POA: Diagnosis not present

## 2013-07-13 DIAGNOSIS — M5124 Other intervertebral disc displacement, thoracic region: Secondary | ICD-10-CM | POA: Diagnosis not present

## 2013-07-13 DIAGNOSIS — M9981 Other biomechanical lesions of cervical region: Secondary | ICD-10-CM | POA: Diagnosis not present

## 2013-07-18 DIAGNOSIS — M5020 Other cervical disc displacement, unspecified cervical region: Secondary | ICD-10-CM | POA: Diagnosis not present

## 2013-07-18 DIAGNOSIS — M999 Biomechanical lesion, unspecified: Secondary | ICD-10-CM | POA: Diagnosis not present

## 2013-07-18 DIAGNOSIS — M5124 Other intervertebral disc displacement, thoracic region: Secondary | ICD-10-CM | POA: Diagnosis not present

## 2013-07-18 DIAGNOSIS — M5030 Other cervical disc degeneration, unspecified cervical region: Secondary | ICD-10-CM | POA: Diagnosis not present

## 2013-07-18 DIAGNOSIS — M9981 Other biomechanical lesions of cervical region: Secondary | ICD-10-CM | POA: Diagnosis not present

## 2013-07-19 DIAGNOSIS — M999 Biomechanical lesion, unspecified: Secondary | ICD-10-CM | POA: Diagnosis not present

## 2013-07-19 DIAGNOSIS — M5020 Other cervical disc displacement, unspecified cervical region: Secondary | ICD-10-CM | POA: Diagnosis not present

## 2013-07-19 DIAGNOSIS — M5030 Other cervical disc degeneration, unspecified cervical region: Secondary | ICD-10-CM | POA: Diagnosis not present

## 2013-07-19 DIAGNOSIS — M5124 Other intervertebral disc displacement, thoracic region: Secondary | ICD-10-CM | POA: Diagnosis not present

## 2013-07-19 DIAGNOSIS — M9981 Other biomechanical lesions of cervical region: Secondary | ICD-10-CM | POA: Diagnosis not present

## 2013-07-21 DIAGNOSIS — M999 Biomechanical lesion, unspecified: Secondary | ICD-10-CM | POA: Diagnosis not present

## 2013-07-21 DIAGNOSIS — M5124 Other intervertebral disc displacement, thoracic region: Secondary | ICD-10-CM | POA: Diagnosis not present

## 2013-07-21 DIAGNOSIS — M9981 Other biomechanical lesions of cervical region: Secondary | ICD-10-CM | POA: Diagnosis not present

## 2013-07-21 DIAGNOSIS — M5030 Other cervical disc degeneration, unspecified cervical region: Secondary | ICD-10-CM | POA: Diagnosis not present

## 2013-07-21 DIAGNOSIS — M5020 Other cervical disc displacement, unspecified cervical region: Secondary | ICD-10-CM | POA: Diagnosis not present

## 2013-07-24 DIAGNOSIS — M5124 Other intervertebral disc displacement, thoracic region: Secondary | ICD-10-CM | POA: Diagnosis not present

## 2013-07-24 DIAGNOSIS — M999 Biomechanical lesion, unspecified: Secondary | ICD-10-CM | POA: Diagnosis not present

## 2013-07-24 DIAGNOSIS — M5030 Other cervical disc degeneration, unspecified cervical region: Secondary | ICD-10-CM | POA: Diagnosis not present

## 2013-07-24 DIAGNOSIS — M9981 Other biomechanical lesions of cervical region: Secondary | ICD-10-CM | POA: Diagnosis not present

## 2013-07-24 DIAGNOSIS — M5020 Other cervical disc displacement, unspecified cervical region: Secondary | ICD-10-CM | POA: Diagnosis not present

## 2013-07-25 DIAGNOSIS — M5020 Other cervical disc displacement, unspecified cervical region: Secondary | ICD-10-CM | POA: Diagnosis not present

## 2013-07-25 DIAGNOSIS — M9981 Other biomechanical lesions of cervical region: Secondary | ICD-10-CM | POA: Diagnosis not present

## 2013-07-25 DIAGNOSIS — M999 Biomechanical lesion, unspecified: Secondary | ICD-10-CM | POA: Diagnosis not present

## 2013-07-25 DIAGNOSIS — M5030 Other cervical disc degeneration, unspecified cervical region: Secondary | ICD-10-CM | POA: Diagnosis not present

## 2013-07-25 DIAGNOSIS — M5124 Other intervertebral disc displacement, thoracic region: Secondary | ICD-10-CM | POA: Diagnosis not present

## 2013-07-28 DIAGNOSIS — M5030 Other cervical disc degeneration, unspecified cervical region: Secondary | ICD-10-CM | POA: Diagnosis not present

## 2013-07-28 DIAGNOSIS — M9981 Other biomechanical lesions of cervical region: Secondary | ICD-10-CM | POA: Diagnosis not present

## 2013-07-28 DIAGNOSIS — M999 Biomechanical lesion, unspecified: Secondary | ICD-10-CM | POA: Diagnosis not present

## 2013-07-28 DIAGNOSIS — M5124 Other intervertebral disc displacement, thoracic region: Secondary | ICD-10-CM | POA: Diagnosis not present

## 2013-07-28 DIAGNOSIS — M5020 Other cervical disc displacement, unspecified cervical region: Secondary | ICD-10-CM | POA: Diagnosis not present

## 2013-08-01 DIAGNOSIS — M5020 Other cervical disc displacement, unspecified cervical region: Secondary | ICD-10-CM | POA: Diagnosis not present

## 2013-08-01 DIAGNOSIS — M9981 Other biomechanical lesions of cervical region: Secondary | ICD-10-CM | POA: Diagnosis not present

## 2013-08-01 DIAGNOSIS — M5030 Other cervical disc degeneration, unspecified cervical region: Secondary | ICD-10-CM | POA: Diagnosis not present

## 2013-08-01 DIAGNOSIS — M999 Biomechanical lesion, unspecified: Secondary | ICD-10-CM | POA: Diagnosis not present

## 2013-08-01 DIAGNOSIS — M5124 Other intervertebral disc displacement, thoracic region: Secondary | ICD-10-CM | POA: Diagnosis not present

## 2013-08-04 DIAGNOSIS — M5020 Other cervical disc displacement, unspecified cervical region: Secondary | ICD-10-CM | POA: Diagnosis not present

## 2013-08-04 DIAGNOSIS — M999 Biomechanical lesion, unspecified: Secondary | ICD-10-CM | POA: Diagnosis not present

## 2013-08-04 DIAGNOSIS — M9981 Other biomechanical lesions of cervical region: Secondary | ICD-10-CM | POA: Diagnosis not present

## 2013-08-04 DIAGNOSIS — M5124 Other intervertebral disc displacement, thoracic region: Secondary | ICD-10-CM | POA: Diagnosis not present

## 2013-08-04 DIAGNOSIS — M5030 Other cervical disc degeneration, unspecified cervical region: Secondary | ICD-10-CM | POA: Diagnosis not present

## 2013-08-07 DIAGNOSIS — M5124 Other intervertebral disc displacement, thoracic region: Secondary | ICD-10-CM | POA: Diagnosis not present

## 2013-08-07 DIAGNOSIS — M9981 Other biomechanical lesions of cervical region: Secondary | ICD-10-CM | POA: Diagnosis not present

## 2013-08-07 DIAGNOSIS — M5020 Other cervical disc displacement, unspecified cervical region: Secondary | ICD-10-CM | POA: Diagnosis not present

## 2013-08-07 DIAGNOSIS — M5030 Other cervical disc degeneration, unspecified cervical region: Secondary | ICD-10-CM | POA: Diagnosis not present

## 2013-08-07 DIAGNOSIS — M999 Biomechanical lesion, unspecified: Secondary | ICD-10-CM | POA: Diagnosis not present

## 2013-08-10 DIAGNOSIS — M999 Biomechanical lesion, unspecified: Secondary | ICD-10-CM | POA: Diagnosis not present

## 2013-08-10 DIAGNOSIS — M5124 Other intervertebral disc displacement, thoracic region: Secondary | ICD-10-CM | POA: Diagnosis not present

## 2013-08-10 DIAGNOSIS — M9981 Other biomechanical lesions of cervical region: Secondary | ICD-10-CM | POA: Diagnosis not present

## 2013-08-10 DIAGNOSIS — M5030 Other cervical disc degeneration, unspecified cervical region: Secondary | ICD-10-CM | POA: Diagnosis not present

## 2013-08-10 DIAGNOSIS — M5020 Other cervical disc displacement, unspecified cervical region: Secondary | ICD-10-CM | POA: Diagnosis not present

## 2013-08-14 DIAGNOSIS — M9981 Other biomechanical lesions of cervical region: Secondary | ICD-10-CM | POA: Diagnosis not present

## 2013-08-14 DIAGNOSIS — M5030 Other cervical disc degeneration, unspecified cervical region: Secondary | ICD-10-CM | POA: Diagnosis not present

## 2013-08-14 DIAGNOSIS — M5124 Other intervertebral disc displacement, thoracic region: Secondary | ICD-10-CM | POA: Diagnosis not present

## 2013-08-14 DIAGNOSIS — M999 Biomechanical lesion, unspecified: Secondary | ICD-10-CM | POA: Diagnosis not present

## 2013-08-14 DIAGNOSIS — M5020 Other cervical disc displacement, unspecified cervical region: Secondary | ICD-10-CM | POA: Diagnosis not present

## 2013-08-17 DIAGNOSIS — M9981 Other biomechanical lesions of cervical region: Secondary | ICD-10-CM | POA: Diagnosis not present

## 2013-08-17 DIAGNOSIS — M5124 Other intervertebral disc displacement, thoracic region: Secondary | ICD-10-CM | POA: Diagnosis not present

## 2013-08-17 DIAGNOSIS — M999 Biomechanical lesion, unspecified: Secondary | ICD-10-CM | POA: Diagnosis not present

## 2013-08-17 DIAGNOSIS — M5020 Other cervical disc displacement, unspecified cervical region: Secondary | ICD-10-CM | POA: Diagnosis not present

## 2013-08-17 DIAGNOSIS — M5030 Other cervical disc degeneration, unspecified cervical region: Secondary | ICD-10-CM | POA: Diagnosis not present

## 2013-08-21 DIAGNOSIS — M5124 Other intervertebral disc displacement, thoracic region: Secondary | ICD-10-CM | POA: Diagnosis not present

## 2013-08-21 DIAGNOSIS — M5020 Other cervical disc displacement, unspecified cervical region: Secondary | ICD-10-CM | POA: Diagnosis not present

## 2013-08-21 DIAGNOSIS — M999 Biomechanical lesion, unspecified: Secondary | ICD-10-CM | POA: Diagnosis not present

## 2013-08-21 DIAGNOSIS — M5030 Other cervical disc degeneration, unspecified cervical region: Secondary | ICD-10-CM | POA: Diagnosis not present

## 2013-08-21 DIAGNOSIS — M9981 Other biomechanical lesions of cervical region: Secondary | ICD-10-CM | POA: Diagnosis not present

## 2013-08-24 DIAGNOSIS — M5020 Other cervical disc displacement, unspecified cervical region: Secondary | ICD-10-CM | POA: Diagnosis not present

## 2013-08-24 DIAGNOSIS — M5030 Other cervical disc degeneration, unspecified cervical region: Secondary | ICD-10-CM | POA: Diagnosis not present

## 2013-08-24 DIAGNOSIS — M5124 Other intervertebral disc displacement, thoracic region: Secondary | ICD-10-CM | POA: Diagnosis not present

## 2013-08-24 DIAGNOSIS — M999 Biomechanical lesion, unspecified: Secondary | ICD-10-CM | POA: Diagnosis not present

## 2013-08-24 DIAGNOSIS — M9981 Other biomechanical lesions of cervical region: Secondary | ICD-10-CM | POA: Diagnosis not present

## 2013-09-01 DIAGNOSIS — Z23 Encounter for immunization: Secondary | ICD-10-CM | POA: Diagnosis not present

## 2013-09-11 DIAGNOSIS — R972 Elevated prostate specific antigen [PSA]: Secondary | ICD-10-CM | POA: Diagnosis not present

## 2013-09-18 DIAGNOSIS — N401 Enlarged prostate with lower urinary tract symptoms: Secondary | ICD-10-CM | POA: Diagnosis not present

## 2013-09-18 DIAGNOSIS — R972 Elevated prostate specific antigen [PSA]: Secondary | ICD-10-CM | POA: Diagnosis not present

## 2013-09-18 DIAGNOSIS — N139 Obstructive and reflux uropathy, unspecified: Secondary | ICD-10-CM | POA: Diagnosis not present

## 2014-02-15 DIAGNOSIS — H26119 Localized traumatic opacities, unspecified eye: Secondary | ICD-10-CM | POA: Diagnosis not present

## 2014-03-07 DIAGNOSIS — M542 Cervicalgia: Secondary | ICD-10-CM | POA: Diagnosis not present

## 2014-03-15 DIAGNOSIS — M542 Cervicalgia: Secondary | ICD-10-CM | POA: Diagnosis not present

## 2014-03-22 DIAGNOSIS — M542 Cervicalgia: Secondary | ICD-10-CM | POA: Diagnosis not present

## 2014-03-26 DIAGNOSIS — R972 Elevated prostate specific antigen [PSA]: Secondary | ICD-10-CM | POA: Diagnosis not present

## 2014-03-26 DIAGNOSIS — L570 Actinic keratosis: Secondary | ICD-10-CM | POA: Diagnosis not present

## 2014-03-27 DIAGNOSIS — M542 Cervicalgia: Secondary | ICD-10-CM | POA: Diagnosis not present

## 2014-03-28 DIAGNOSIS — N139 Obstructive and reflux uropathy, unspecified: Secondary | ICD-10-CM | POA: Diagnosis not present

## 2014-03-28 DIAGNOSIS — R972 Elevated prostate specific antigen [PSA]: Secondary | ICD-10-CM | POA: Diagnosis not present

## 2014-03-28 DIAGNOSIS — N401 Enlarged prostate with lower urinary tract symptoms: Secondary | ICD-10-CM | POA: Diagnosis not present

## 2014-04-04 DIAGNOSIS — M542 Cervicalgia: Secondary | ICD-10-CM | POA: Diagnosis not present

## 2014-04-10 DIAGNOSIS — M542 Cervicalgia: Secondary | ICD-10-CM | POA: Diagnosis not present

## 2014-04-12 DIAGNOSIS — M542 Cervicalgia: Secondary | ICD-10-CM | POA: Diagnosis not present

## 2014-04-16 DIAGNOSIS — M542 Cervicalgia: Secondary | ICD-10-CM | POA: Diagnosis not present

## 2014-04-17 DIAGNOSIS — M542 Cervicalgia: Secondary | ICD-10-CM | POA: Diagnosis not present

## 2014-04-20 DIAGNOSIS — M542 Cervicalgia: Secondary | ICD-10-CM | POA: Diagnosis not present

## 2014-04-24 DIAGNOSIS — R972 Elevated prostate specific antigen [PSA]: Secondary | ICD-10-CM | POA: Diagnosis not present

## 2014-05-01 DIAGNOSIS — N139 Obstructive and reflux uropathy, unspecified: Secondary | ICD-10-CM | POA: Diagnosis not present

## 2014-05-01 DIAGNOSIS — R972 Elevated prostate specific antigen [PSA]: Secondary | ICD-10-CM | POA: Diagnosis not present

## 2014-05-01 DIAGNOSIS — M5030 Other cervical disc degeneration, unspecified cervical region: Secondary | ICD-10-CM | POA: Diagnosis not present

## 2014-05-01 DIAGNOSIS — N401 Enlarged prostate with lower urinary tract symptoms: Secondary | ICD-10-CM | POA: Diagnosis not present

## 2014-05-09 DIAGNOSIS — D1801 Hemangioma of skin and subcutaneous tissue: Secondary | ICD-10-CM | POA: Diagnosis not present

## 2014-05-09 DIAGNOSIS — D235 Other benign neoplasm of skin of trunk: Secondary | ICD-10-CM | POA: Diagnosis not present

## 2014-05-09 DIAGNOSIS — L570 Actinic keratosis: Secondary | ICD-10-CM | POA: Diagnosis not present

## 2014-05-09 DIAGNOSIS — L819 Disorder of pigmentation, unspecified: Secondary | ICD-10-CM | POA: Diagnosis not present

## 2014-06-28 DIAGNOSIS — M542 Cervicalgia: Secondary | ICD-10-CM | POA: Diagnosis not present

## 2014-06-28 DIAGNOSIS — M5030 Other cervical disc degeneration, unspecified cervical region: Secondary | ICD-10-CM | POA: Diagnosis not present

## 2014-08-01 DIAGNOSIS — M542 Cervicalgia: Secondary | ICD-10-CM | POA: Diagnosis not present

## 2014-08-11 DIAGNOSIS — Z23 Encounter for immunization: Secondary | ICD-10-CM | POA: Diagnosis not present

## 2014-08-29 DIAGNOSIS — L570 Actinic keratosis: Secondary | ICD-10-CM | POA: Diagnosis not present

## 2014-08-29 DIAGNOSIS — L814 Other melanin hyperpigmentation: Secondary | ICD-10-CM | POA: Diagnosis not present

## 2014-08-29 DIAGNOSIS — D225 Melanocytic nevi of trunk: Secondary | ICD-10-CM | POA: Diagnosis not present

## 2014-08-29 DIAGNOSIS — L821 Other seborrheic keratosis: Secondary | ICD-10-CM | POA: Diagnosis not present

## 2014-10-24 DIAGNOSIS — R972 Elevated prostate specific antigen [PSA]: Secondary | ICD-10-CM | POA: Diagnosis not present

## 2014-10-31 DIAGNOSIS — R351 Nocturia: Secondary | ICD-10-CM | POA: Diagnosis not present

## 2014-10-31 DIAGNOSIS — R972 Elevated prostate specific antigen [PSA]: Secondary | ICD-10-CM | POA: Diagnosis not present

## 2014-10-31 DIAGNOSIS — N401 Enlarged prostate with lower urinary tract symptoms: Secondary | ICD-10-CM | POA: Diagnosis not present

## 2015-03-28 ENCOUNTER — Ambulatory Visit (INDEPENDENT_AMBULATORY_CARE_PROVIDER_SITE_OTHER): Payer: Medicare Other | Admitting: Family Medicine

## 2015-03-28 ENCOUNTER — Encounter: Payer: Self-pay | Admitting: Family Medicine

## 2015-03-28 ENCOUNTER — Telehealth: Payer: Self-pay | Admitting: Family Medicine

## 2015-03-28 VITALS — BP 110/60 | HR 68 | Temp 98.9°F | Ht 66.0 in | Wt 194.2 lb

## 2015-03-28 DIAGNOSIS — K5732 Diverticulitis of large intestine without perforation or abscess without bleeding: Secondary | ICD-10-CM | POA: Diagnosis not present

## 2015-03-28 MED ORDER — CIPROFLOXACIN HCL 750 MG PO TABS: 750.0000 mg | ORAL_TABLET | Freq: Two times a day (BID) | ORAL | Status: DC

## 2015-03-28 MED ORDER — METRONIDAZOLE 500 MG PO TABS: 500.0000 mg | ORAL_TABLET | Freq: Three times a day (TID) | ORAL | Status: DC

## 2015-03-28 NOTE — Telephone Encounter (Signed)
Please obtain more information and assist with triage to see if needs urgent evaluation.

## 2015-03-28 NOTE — Progress Notes (Signed)
Dr. Frederico Hamman T. Barbaraann Avans, MD, [facility] [place], [address] Phone: [phone] Fax: [phone]  03/28/2015  Patient: [name], MRN: [id number], DOB: 08-27-1946, 69 y.o.  Primary Physician:  Eliezer Lofts, MD  Chief Complaint: Abdominal Pain  Subjective:   [name] is a 69 y.o. very pleasant male patient who presents with the following:  Tues - Wed was getting a lot of gas, and theat cont for a couple of days and gas has gonee away. Passing some stool and ttp on the L. This is gotten a little bit better, he continues to have persistent pain in the left lower quadrant primarily.  He has been able to eat and drink, he actually ate hot dogs for lunch today.  He has not had any kind of fever, and he thinks he may be getting somewhat better.  I reviewed the patient's colonoscopy findings, and he was noted to have some moderate diverticulosis on exam.  Eating and drinking ok.  Overall healthy.    Past Medical History, Surgical History, Social History, Family History, Problem List, Medications, and Allergies have been reviewed and updated if relevant.  ROS: GEN: Acute illness details above GI: Tolerating PO intake GU: maintaining adequate hydration and urination Pulm: No SOB Interactive and getting along well at home.  Otherwise, ROS is as per the HPI.   Objective:   BP 110/60 mmHg  Pulse 68  Temp(Src) 98.9 F (37.2 C) (Oral)  Ht 5\' 6"  (1.676 m)  Wt 194 lb 4 oz (88.111 kg)  BMI 31.37 kg/m2  GEN: WDWN, NAD, Non-toxic, A & O x 3 HEENT: Atraumatic, Normocephalic. Neck supple. No masses, No LAD. Ears and Nose: No external deformity. CV: RRR, No M/G/R. No JVD. No thrill. No extra heart sounds. PULM: CTA B, no wheezes, crackles, rhonchi. No retractions. No resp. distress. No accessory muscle use. ABD: S, LLQ moderate tenderness, ND, +BS. No rebound. No HSM. EXTR: No c/c/e NEURO Normal gait.  PSYCH:  Normally interactive. Conversant. Not depressed or anxious appearing.  Calm demeanor.     Laboratory and Imaging Data:  Assessment and Plan:   Diverticulitis of colon  Appears to be classic diverticulitis in the left lower quadrant.  Treat as such.  Follow-up: prn if not improving  New Prescriptions   CIPROFLOXACIN (CIPRO) 750 MG TABLET    Take 1 tablet (750 mg total) by mouth 2 (two) times daily.   METRONIDAZOLE (FLAGYL) 500 MG TABLET    Take 1 tablet (500 mg total) by mouth 3 (three) times daily.   No orders of the defined types were placed in this encounter.    Signed,  Maud Deed. Srihan Brutus, MD   Patient's Medications  New Prescriptions   CIPROFLOXACIN (CIPRO) 750 MG TABLET    Take 1 tablet (750 mg total) by mouth 2 (two) times daily.   METRONIDAZOLE (FLAGYL) 500 MG TABLET    Take 1 tablet (500 mg total) by mouth 3 (three) times daily.  Previous Medications   CHOLECALCIFEROL (VITAMIN D) 1000 UNITS TABLET    Take 1,000 Units by mouth daily.   CYANOCOBALAMIN (VITAMIN B-12 CR PO)    Take 1 tablet by mouth daily.   MULTIPLE VITAMINS-MINERALS (CENTRUM SILVER PO)    Take one by mouth daily    OMEPRAZOLE (PRILOSEC OTC) 20 MG TABLET    Take 20 mg by mouth daily.   TRAMADOL (ULTRAM) 50 MG TABLET    Take 50  mg by mouth at bedtime.  Modified Medications   No medications on file  Discontinued Medications   ALPHA LIPOIC ACID POWD    Once daily    FOLIC ACID PO    Take 1 tablet by mouth daily.   IBUPROFEN (ADVIL,MOTRIN) 600 MG TABLET    Take 600 mg by mouth 3 (three) times daily.

## 2015-03-28 NOTE — Telephone Encounter (Addendum)
[facility] scheduled appt 03/28/15 at 5:45 pm with Dr Lorelei Pont. Also sent to Butch Penny CMA to verify OK for pt to wait until then to be seen.

## 2015-03-28 NOTE — Telephone Encounter (Signed)
[facility] Patient Name: [name] DOB: 05-29-1946 Initial Comment caller states he has lower abdominal discomfort on left Nurse Assessment Nurse: Markus Daft, RN, Sherre Poot Date/Time (Eastern Time): 03/28/2015 12:15:35 PM Confirm and document reason for call. If symptomatic, describe symptoms. ---Caller states he has LLQ abdominal pain/pressure/tenderness to touch, but not severe, and today he felt need to have BM, had BM this AM which has seemed to relieve the pain for now. Then states that it is still a little tender. No swelling, discoloration, or fever. S/S started in last 2 days off/on. Has the patient traveled out of the country within the last 30 days? ---No Does the patient require triage? ---Yes Related visit to physician within the last 2 weeks? ---No Does the PT have any chronic conditions? (i.e. diabetes, asthma, etc.) ---Yes List chronic conditions. ---Spinal stenosis - Ultram QHS PRN pain; Enlarged prostate Guidelines Guideline Title Affirmed Question Affirmed Notes Abdominal Pain - Male Age > 60 years Final Disposition User See Physician within [place], [place], Sherre Poot Comments Appt made for today, 03/28/15 at 5:45 pm with Dr. Lorelei Pont.

## 2015-03-28 NOTE — Telephone Encounter (Signed)
Dr. Lorelei Pont would like you to call and triage patient to see if he needs to be seen more urgently.

## 2015-03-28 NOTE — Telephone Encounter (Signed)
Attempted to call pt at work #; pt is in a meeting with a client and cannot be disturbed;not sure how long pt will be in meeting. Pt will call [facility] after meeting. This note if FYI for Dr Lorelei Pont.

## 2015-03-28 NOTE — Telephone Encounter (Signed)
Agreed -

## 2015-03-28 NOTE — Telephone Encounter (Addendum)
Pt called back; 2 days ago pt started with LLQ discomfort; Now pain level is 1 -2 , if pushes in on LLQ feels tender to touch;previously pt had gas pain but no diarrhea, constipation or gas pain today. Pt had normal BM this morning. Pt has experienced some frequency of urination, no burning and no fever. Pt does not think he needs to be seen urgently; pt said will take 45 mins for him to come from where pt works in [employer]. Pt will keep 5:45 pm appt. FYI to Dr Lorelei Pont.

## 2015-03-28 NOTE — Progress Notes (Signed)
Pre visit review using our clinic review tool, if applicable. No additional management support is needed unless otherwise documented below in the visit note. 

## 2015-04-25 DIAGNOSIS — R972 Elevated prostate specific antigen [PSA]: Secondary | ICD-10-CM | POA: Diagnosis not present

## 2015-05-02 DIAGNOSIS — R351 Nocturia: Secondary | ICD-10-CM | POA: Diagnosis not present

## 2015-05-02 DIAGNOSIS — R972 Elevated prostate specific antigen [PSA]: Secondary | ICD-10-CM | POA: Diagnosis not present

## 2015-05-02 DIAGNOSIS — N401 Enlarged prostate with lower urinary tract symptoms: Secondary | ICD-10-CM | POA: Diagnosis not present

## 2015-05-29 DIAGNOSIS — M542 Cervicalgia: Secondary | ICD-10-CM | POA: Diagnosis not present

## 2015-09-18 DIAGNOSIS — L603 Nail dystrophy: Secondary | ICD-10-CM | POA: Diagnosis not present

## 2015-09-18 DIAGNOSIS — L821 Other seborrheic keratosis: Secondary | ICD-10-CM | POA: Diagnosis not present

## 2015-09-18 DIAGNOSIS — Z23 Encounter for immunization: Secondary | ICD-10-CM | POA: Diagnosis not present

## 2015-09-18 DIAGNOSIS — Z85828 Personal history of other malignant neoplasm of skin: Secondary | ICD-10-CM | POA: Diagnosis not present

## 2015-09-18 DIAGNOSIS — D1801 Hemangioma of skin and subcutaneous tissue: Secondary | ICD-10-CM | POA: Diagnosis not present

## 2015-12-05 ENCOUNTER — Ambulatory Visit (INDEPENDENT_AMBULATORY_CARE_PROVIDER_SITE_OTHER): Payer: Medicare Other | Admitting: Family Medicine

## 2015-12-05 VITALS — BP 146/80 | HR 81 | Temp 98.6°F | Resp 20 | Ht 66.54 in | Wt 203.0 lb

## 2015-12-05 DIAGNOSIS — T07XXXA Unspecified multiple injuries, initial encounter: Secondary | ICD-10-CM

## 2015-12-05 DIAGNOSIS — Z23 Encounter for immunization: Secondary | ICD-10-CM

## 2015-12-05 DIAGNOSIS — S0121XA Laceration without foreign body of nose, initial encounter: Secondary | ICD-10-CM | POA: Diagnosis not present

## 2015-12-05 DIAGNOSIS — Z283 Underimmunization status: Secondary | ICD-10-CM

## 2015-12-05 DIAGNOSIS — Z2839 Other underimmunization status: Secondary | ICD-10-CM

## 2015-12-05 NOTE — Patient Instructions (Signed)
Keep the wounds clean with lightly soapy water. Keep dressed with some Polysporin ointment for the next couple of days, then leave open to the air.  Return if any further concerns  You have received a TDap vaccine today.  Don't put any ointment or cream on the nose.  If any suspicion of internal head injury symptoms go straight to the emergency room  Head Injury, Adult You have received a head injury. It does not appear serious at this time. Headaches and vomiting are common following head injury. It should be easy to awaken from sleeping. Sometimes it is necessary for you to stay in the emergency department for a while for observation. Sometimes admission to the hospital may be needed. After injuries such as yours, most problems occur within the first 24 hours, but side effects may occur up to 7-10 days after the injury. It is important for you to carefully monitor your condition and contact your health care provider or seek immediate medical care if there is a change in your condition. WHAT ARE THE TYPES OF HEAD INJURIES? Head injuries can be as minor as a bump. Some head injuries can be more severe. More severe head injuries include:  A jarring injury to the brain (concussion).  A bruise of the brain (contusion). This mean there is bleeding in the brain that can cause swelling.  A cracked skull (skull fracture).  Bleeding in the brain that collects, clots, and forms a bump (hematoma). WHAT CAUSES A HEAD INJURY? A serious head injury is most likely to happen to someone who is in a car wreck and is not wearing a seat belt. Other causes of major head injuries include bicycle or motorcycle accidents, sports injuries, and falls. HOW ARE HEAD INJURIES DIAGNOSED? A complete history of the event leading to the injury and your current symptoms will be helpful in diagnosing head injuries. Many times, pictures of the brain, such as CT or MRI are needed to see the extent of the injury. Often, an  overnight hospital stay is necessary for observation.  WHEN SHOULD I SEEK IMMEDIATE MEDICAL CARE?  You should get help right away if:  You have confusion or drowsiness.  You feel sick to your stomach (nauseous) or have continued, forceful vomiting.  You have dizziness or unsteadiness that is getting worse.  You have severe, continued headaches not relieved by medicine. Only take over-the-counter or prescription medicines for pain, fever, or discomfort as directed by your health care provider.  You do not have normal function of the arms or legs or are unable to walk.  You notice changes in the black spots in the center of the colored part of your eye (pupil).  You have a clear or bloody fluid coming from your nose or ears.  You have a loss of vision. During the next 24 hours after the injury, you must stay with someone who can watch you for the warning signs. This person should contact local emergency services (911 in the [place] if you have seizures, you become unconscious, or you are unable to wake up. HOW CAN I PREVENT A HEAD INJURY IN THE FUTURE? The most important factor for preventing major head injuries is avoiding motor vehicle accidents. To minimize the potential for damage to your head, it is crucial to wear seat belts while riding in motor vehicles. Wearing helmets while bike riding and playing collision sports (like football) is also helpful. Also, avoiding dangerous activities around the house will further help reduce your risk  of head injury.  WHEN CAN I RETURN TO NORMAL ACTIVITIES AND ATHLETICS? You should be reevaluated by your health care provider before returning to these activities. If you have any of the following symptoms, you should not return to activities or contact sports until 1 week after the symptoms have stopped:  Persistent headache.  Dizziness or vertigo.  Poor attention and concentration.  Confusion.  Memory problems.  Nausea or vomiting.  Fatigue  or tire easily.  Irritability.  Intolerant of bright lights or loud noises.  Anxiety or depression.  Disturbed sleep. MAKE SURE YOU:   Understand these instructions.  Will watch your condition.  Will get help right away if you are not doing well or get worse.   This information is not intended to replace advice given to you by your health care provider. Make sure you discuss any questions you have with your health care provider.   Document Released: 11/02/2005 Document Revised: 11/23/2014 Document Reviewed: 07/10/2013 Elsevier Interactive Patient Education [employer].

## 2015-12-05 NOTE — Progress Notes (Signed)
Patient ID: [name], male    DOB: 07/01/1946  Age: 70 y.o. MRN: [id number]  Chief Complaint  Patient presents with  . 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 He fell forward hitting his forehead, scraping his glasses, cutting his nose, and abrasions on his hands. No loss of consciousness. He went on into the [place] unattended to his duties while the people scurried around and got some bandages to put on him. His wife brought him here to the office. He is achy all over from having fallen.  He does not know when his last tetanus vaccination was.  Current allergies, medications, problem list, past/family and social histories reviewed.  Objective:  BP 146/80 mmHg  Pulse 81  Temp(Src) 98.6 F (37 C) (Oral)  Resp 20  Ht 5' 6.53" (1.69 m)  Wt 203 lb (92.08 kg)  BMI 32.24 kg/m2  SpO2 96%  Alert and oriented but has abrasions on his face. His glasses are scratched out. He has a little cut transversely across the bridge of the nose balloon glasses. He has a large area of abrasion above his left eyebrow, about 3 x 4 cm with swelling and erythema. His neck is supple and nontender. He has small abrasions on both hands.  I trimmed a little scan off one of the abrasions on his left wrist so he wouldn't pick at it.  Procedure note The 1 cm laceration on the bridge of the nose was treated with Dermabond. Good seal was obtained. Patient tolerated this well.  Assessment & Plan:   Assessment: 1. Abrasion, multiple sites   2. Laceration of nose, initial encounter   3. Immunization deficiency       Plan: Cautioned his wife about the possibility of closed head injury. The neck seems fine. He can take some Tylenol ibuprofen for the achiness. Return if he is having further problems.  Orders Placed This Encounter  Procedures  . Tdap vaccine greater than or equal to 7yo IM        Patient Instructions  Keep the wounds clean with lightly soapy water. Keep dressed with some Polysporin ointment for the next couple of days, then leave open to the air.  Return if any further concerns  You have received a TDap vaccine today.  Don't put any ointment or cream on the nose.  If any suspicion of internal head injury symptoms go straight to the emergency room  Head Injury, Adult You have received a head injury. It does not appear serious at this time. Headaches and vomiting are common following head injury. It should be easy to awaken from sleeping. Sometimes it is necessary for you to stay in the emergency department for a while for observation. Sometimes admission to the hospital may be needed. After injuries such as yours, most problems occur within the first 24 hours, but side effects may occur up to 7-10 days after the injury. It is important for you to carefully monitor your condition and contact your health care provider or seek immediate medical care if there is a change in your condition. WHAT ARE THE TYPES OF HEAD INJURIES? Head injuries can be as minor as a bump. Some head injuries can be more severe. More severe head injuries include:  A jarring injury to the brain (concussion).  A bruise of the brain (contusion). This mean there  is bleeding in the brain that can cause swelling.  A cracked skull (skull fracture).  Bleeding in the brain that collects, clots, and forms a bump (hematoma). WHAT CAUSES A HEAD INJURY? A serious head injury is most likely to happen to someone who is in a car wreck and is not wearing a seat belt. Other causes of major head injuries include bicycle or motorcycle accidents, sports injuries, and falls. HOW ARE HEAD INJURIES DIAGNOSED? A complete history of the event leading to the injury and your current symptoms will be helpful in diagnosing head injuries. Many times, pictures of the brain, such as CT or MRI are needed to see the  extent of the injury. Often, an overnight hospital stay is necessary for observation.  WHEN SHOULD I SEEK IMMEDIATE MEDICAL CARE?  You should get help right away if:  You have confusion or drowsiness.  You feel sick to your stomach (nauseous) or have continued, forceful vomiting.  You have dizziness or unsteadiness that is getting worse.  You have severe, continued headaches not relieved by medicine. Only take over-the-counter or prescription medicines for pain, fever, or discomfort as directed by your health care provider.  You do not have normal function of the arms or legs or are unable to walk.  You notice changes in the black spots in the center of the colored part of your eye (pupil).  You have a clear or bloody fluid coming from your nose or ears.  You have a loss of vision. During the next 24 hours after the injury, you must stay with someone who can watch you for the warning signs. This person should contact local emergency services (911 in the [place] if you have seizures, you become unconscious, or you are unable to wake up. HOW CAN I PREVENT A HEAD INJURY IN THE FUTURE? The most important factor for preventing major head injuries is avoiding motor vehicle accidents. To minimize the potential for damage to your head, it is crucial to wear seat belts while riding in motor vehicles. Wearing helmets while bike riding and playing collision sports (like football) is also helpful. Also, avoiding dangerous activities around the house will further help reduce your risk of head injury.  WHEN CAN I RETURN TO NORMAL ACTIVITIES AND ATHLETICS? You should be reevaluated by your health care provider before returning to these activities. If you have any of the following symptoms, you should not return to activities or contact sports until 1 week after the symptoms have stopped:  Persistent headache.  Dizziness or vertigo.  Poor attention and concentration.  Confusion.  Memory  problems.  Nausea or vomiting.  Fatigue or tire easily.  Irritability.  Intolerant of bright lights or loud noises.  Anxiety or depression.  Disturbed sleep. MAKE SURE YOU:   Understand these instructions.  Will watch your condition.  Will get help right away if you are not doing well or get worse.   This information is not intended to replace advice given to you by your health care provider. Make sure you discuss any questions you have with your health care provider.   Document Released: 11/02/2005 Document Revised: 11/23/2014 Document Reviewed: 07/10/2013 Elsevier Interactive Patient Education [employer].      Return if symptoms worsen or fail to improve.   Maze Corniel, MD 12/05/2015

## 2015-12-13 DIAGNOSIS — M50322 Other cervical disc degeneration at C5-C6 level: Secondary | ICD-10-CM | POA: Diagnosis not present

## 2015-12-13 DIAGNOSIS — M542 Cervicalgia: Secondary | ICD-10-CM | POA: Diagnosis not present

## 2016-01-02 DIAGNOSIS — R972 Elevated prostate specific antigen [PSA]: Secondary | ICD-10-CM | POA: Diagnosis not present

## 2016-01-09 DIAGNOSIS — R351 Nocturia: Secondary | ICD-10-CM | POA: Diagnosis not present

## 2016-01-09 DIAGNOSIS — Z0000 Encounter for general adult medical examination without abnormal findings: Secondary | ICD-10-CM | POA: Diagnosis not present

## 2016-01-09 DIAGNOSIS — R972 Elevated prostate specific antigen [PSA]: Secondary | ICD-10-CM | POA: Diagnosis not present

## 2016-01-09 DIAGNOSIS — N401 Enlarged prostate with lower urinary tract symptoms: Secondary | ICD-10-CM | POA: Diagnosis not present

## 2016-04-14 ENCOUNTER — Ambulatory Visit (INDEPENDENT_AMBULATORY_CARE_PROVIDER_SITE_OTHER): Payer: Medicare Other | Admitting: Family Medicine

## 2016-04-14 ENCOUNTER — Encounter: Payer: Self-pay | Admitting: Family Medicine

## 2016-04-14 VITALS — BP 134/78 | HR 69 | Temp 98.6°F | Wt 188.2 lb

## 2016-04-14 DIAGNOSIS — W57XXXA Bitten or stung by nonvenomous insect and other nonvenomous arthropods, initial encounter: Secondary | ICD-10-CM | POA: Diagnosis not present

## 2016-04-14 DIAGNOSIS — T148 Other injury of unspecified body region: Secondary | ICD-10-CM | POA: Diagnosis not present

## 2016-04-14 HISTORY — DX: Bitten or stung by nonvenomous insect and other nonvenomous arthropods, initial encounter: W57.XXXA

## 2016-04-14 NOTE — Assessment & Plan Note (Signed)
New- now with probable localized reaction. No indication of infection or signs of tick born illness. Reassurance provided. Discussed red flag signs for tick born illness that would require immediate follow up. Call or return to clinic prn if these symptoms worsen or fail to improve as anticipated. The patient indicates understanding of these issues and agrees with the plan.

## 2016-04-14 NOTE — Progress Notes (Signed)
Pre visit review using our clinic review tool, if applicable. No additional management support is needed unless otherwise documented below in the visit note. 

## 2016-04-14 NOTE — Progress Notes (Signed)
Subjective:   Patient ID: [name], male    DOB: March 07, 1946, 70 y.o.   MRN: [id number]  [name] is a pleasant 70 y.o. year old male pt of Dr. Diona Browner, new to me, who presents to clinic today with Insect Bite  on 04/14/2016  HPI:  Tick bite- last week, felt a tick on his upper back right leg/thigh area.  Removed tick.  It was not very engorged.  Denies any HA, fevers, photophobia, nausea, vomiting, rashes or myalgias.  Wanted it be evaluated since he cannot see the area well.   Current Outpatient Prescriptions on File Prior to Visit  Medication Sig Dispense Refill  . cholecalciferol (VITAMIN D) 1000 UNITS tablet Take 1,000 Units by mouth daily.    . Cyanocobalamin (VITAMIN B-12 CR PO) Take 1 tablet by mouth daily. Reported on 12/05/2015    . Multiple Vitamins-Minerals (CENTRUM SILVER PO) Take one by mouth daily     . omeprazole (PRILOSEC OTC) 20 MG tablet Take 20 mg by mouth daily.    . traMADol (ULTRAM) 50 MG tablet Take 50 mg by mouth at bedtime.  0   No current facility-administered medications on file prior to visit.    No Known Allergies  Past Medical History  Diagnosis Date  . GERD (gastroesophageal reflux disease)   . Allergic rhinitis   . Esophageal stricture     EGD 11/07.  stricture dilated.  hiatal hernia, gastritis  . Arthritis     Past Surgical History  Procedure Laterality Date  . Skin cancer excision      Family History  Problem Relation Age of Onset  . Hypertension Mother   . Stroke Father   . Colon cancer Neg Hx     Social History   Social History  . Marital Status: Married    Spouse Name: N/A  . Number of Children: 2  . Years of Education: N/A   Occupational History  . Attorney    Social History Main Topics  . Smoking status: Never Smoker   . Smokeless tobacco: Never Used  . Alcohol Use: No  . Drug Use: No  . Sexual Activity: Not on file   Other Topics Concern  . Not on file   Social History Narrative   Regular  exercise- walks 3-4 times per week, lifts weights, bicycling 10 miles every other day.   Diet- 3 meals , lots of fluids, stopped Coke, changed to green tea.   The PMH, PSH, Social History, Family History, Medications, and allergies have been reviewed in [facility], and have been updated if relevant.   Review of Systems  HENT: Negative.   Eyes: Negative.   Musculoskeletal: Negative.   Skin: Negative.   Neurological: Negative.   Hematological: Negative.   All other systems reviewed and are negative.      Objective:    BP 134/78 mmHg  Pulse 69  Temp(Src) 98.6 F (37 C) (Oral)  Wt 188 lb 4 oz (85.39 kg)  SpO2 95%   Physical Exam  Constitutional: He is oriented to person, place, and time. He appears well-developed and well-nourished. No distress.  HENT:  Head: Normocephalic.  Eyes: Conjunctivae are normal.  Cardiovascular: Normal rate.   Pulmonary/Chest: Effort normal.  Musculoskeletal: Normal range of motion.  Neurological: He is alert and oriented to person, place, and time. No cranial nerve deficit.  Skin:     Psychiatric: He has a normal mood and affect. His behavior is normal. Judgment and thought content normal.  Nursing note and vitals reviewed.         Assessment & Plan:   Tick bite No Follow-up on file.

## 2016-06-29 DIAGNOSIS — R972 Elevated prostate specific antigen [PSA]: Secondary | ICD-10-CM | POA: Diagnosis not present

## 2016-07-10 DIAGNOSIS — R972 Elevated prostate specific antigen [PSA]: Secondary | ICD-10-CM | POA: Diagnosis not present

## 2016-07-22 DIAGNOSIS — M50322 Other cervical disc degeneration at C5-C6 level: Secondary | ICD-10-CM | POA: Diagnosis not present

## 2016-08-13 DIAGNOSIS — M50322 Other cervical disc degeneration at C5-C6 level: Secondary | ICD-10-CM | POA: Diagnosis not present

## 2016-08-13 DIAGNOSIS — M542 Cervicalgia: Secondary | ICD-10-CM | POA: Diagnosis not present

## 2016-08-14 DIAGNOSIS — Z23 Encounter for immunization: Secondary | ICD-10-CM | POA: Diagnosis not present

## 2016-09-16 DIAGNOSIS — Z85828 Personal history of other malignant neoplasm of skin: Secondary | ICD-10-CM | POA: Diagnosis not present

## 2016-09-16 DIAGNOSIS — D225 Melanocytic nevi of trunk: Secondary | ICD-10-CM | POA: Diagnosis not present

## 2016-09-16 DIAGNOSIS — L814 Other melanin hyperpigmentation: Secondary | ICD-10-CM | POA: Diagnosis not present

## 2016-09-16 DIAGNOSIS — L821 Other seborrheic keratosis: Secondary | ICD-10-CM | POA: Diagnosis not present

## 2016-11-02 ENCOUNTER — Telehealth: Payer: Self-pay | Admitting: Family Medicine

## 2016-11-02 DIAGNOSIS — Z23 Encounter for immunization: Secondary | ICD-10-CM | POA: Diagnosis not present

## 2016-11-02 NOTE — Telephone Encounter (Signed)
Pt called wanting to know when his last pneumonia shot was.  [facility] called and told him he was due

## 2016-11-02 NOTE — Telephone Encounter (Signed)
Mr. [name] notified that we have no record of him getting any kind of pneumonia vaccine here at our office.

## 2016-12-29 DIAGNOSIS — M50322 Other cervical disc degeneration at C5-C6 level: Secondary | ICD-10-CM | POA: Diagnosis not present

## 2017-02-01 DIAGNOSIS — M21161 Varus deformity, not elsewhere classified, right knee: Secondary | ICD-10-CM | POA: Diagnosis not present

## 2017-02-01 DIAGNOSIS — M1711 Unilateral primary osteoarthritis, right knee: Secondary | ICD-10-CM | POA: Diagnosis not present

## 2017-02-01 DIAGNOSIS — M2241 Chondromalacia patellae, right knee: Secondary | ICD-10-CM | POA: Diagnosis not present

## 2017-04-28 DIAGNOSIS — M50322 Other cervical disc degeneration at C5-C6 level: Secondary | ICD-10-CM | POA: Diagnosis not present

## 2017-05-21 ENCOUNTER — Encounter: Payer: Self-pay | Admitting: Family Medicine

## 2017-05-21 ENCOUNTER — Ambulatory Visit (INDEPENDENT_AMBULATORY_CARE_PROVIDER_SITE_OTHER): Payer: Medicare Other | Admitting: Family Medicine

## 2017-05-21 VITALS — BP 166/89 | HR 84 | Temp 98.5°F | Resp 16 | Ht 66.5 in | Wt 194.6 lb

## 2017-05-21 DIAGNOSIS — I10 Essential (primary) hypertension: Secondary | ICD-10-CM | POA: Diagnosis not present

## 2017-05-21 DIAGNOSIS — R61 Generalized hyperhidrosis: Secondary | ICD-10-CM | POA: Diagnosis not present

## 2017-05-21 DIAGNOSIS — R011 Cardiac murmur, unspecified: Secondary | ICD-10-CM | POA: Diagnosis not present

## 2017-05-21 DIAGNOSIS — R030 Elevated blood-pressure reading, without diagnosis of hypertension: Secondary | ICD-10-CM

## 2017-05-21 LAB — POCT URINALYSIS DIP (MANUAL ENTRY)
BILIRUBIN UA: NEGATIVE
GLUCOSE UA: NEGATIVE mg/dL
Ketones, POC UA: NEGATIVE mg/dL
Leukocytes, UA: NEGATIVE
Nitrite, UA: NEGATIVE
Protein Ur, POC: NEGATIVE mg/dL
SPEC GRAV UA: 1.025 (ref 1.010–1.025)
UROBILINOGEN UA: 0.2 U/dL
pH, UA: 6 (ref 5.0–8.0)

## 2017-05-21 LAB — COMPREHENSIVE METABOLIC PANEL
ALT: 38 IU/L (ref 0–44)
AST: 25 IU/L (ref 0–40)
Albumin/Globulin Ratio: 1.5 (ref 1.2–2.2)
Albumin: 4.3 g/dL (ref 3.5–4.8)
Alkaline Phosphatase: 55 IU/L (ref 39–117)
BUN/Creatinine Ratio: 15 (ref 10–24)
BUN: 13 mg/dL (ref 8–27)
Bilirubin Total: 0.5 mg/dL (ref 0.0–1.2)
CALCIUM: 9.7 mg/dL (ref 8.6–10.2)
CO2: 24 mmol/L (ref 20–29)
CREATININE: 0.89 mg/dL (ref 0.76–1.27)
Chloride: 103 mmol/L (ref 96–106)
GFR calc Af Amer: 99 mL/min/{1.73_m2} (ref >59)
GFR, EST NON AFRICAN AMERICAN: 86 mL/min/{1.73_m2} (ref >59)
Globulin, Total: 2.8 g/dL (ref 1.5–4.5)
Glucose: 114 mg/dL — ABNORMAL HIGH (ref 65–99)
Potassium: 4.2 mmol/L (ref 3.5–5.2)
Sodium: 142 mmol/L (ref 134–144)
Total Protein: 7.1 g/dL (ref 6.0–8.5)

## 2017-05-21 LAB — POCT CBC
Granulocyte percent: 65.7 %G (ref 37–80)
HCT, POC: 44.4 % (ref 43.5–53.7)
HEMOGLOBIN: 15.2 g/dL (ref 14.1–18.1)
Lymph, poc: 1.6 (ref 0.6–3.4)
MCH: 31.3 pg — AB (ref 27–31.2)
MCHC: 34.1 g/dL (ref 31.8–35.4)
MCV: 91.5 fL (ref 80–97)
MID (cbc): 0.6 (ref 0–0.9)
MPV: 7 fL (ref 0–99.8)
POC Granulocyte: 4.2 (ref 2–6.9)
POC LYMPH PERCENT: 25.6 %L (ref 10–50)
POC MID %: 8.7 % (ref 0–12)
Platelet Count, POC: 185 10*3/uL (ref 142–424)
RBC: 4.85 M/uL (ref 4.69–6.13)
RDW, POC: 13.3 %
WBC: 6.4 10*3/uL (ref 4.6–10.2)

## 2017-05-21 LAB — TROPONIN I

## 2017-05-21 MED ORDER — PANTOPRAZOLE SODIUM 40 MG PO TBEC: 40.0000 mg | DELAYED_RELEASE_TABLET | Freq: Every day | ORAL | 0 refills | Status: DC

## 2017-05-21 MED ORDER — ASPIRIN EC 81 MG PO TBEC: 81.0000 mg | DELAYED_RELEASE_TABLET | Freq: Every day | ORAL | Status: DC

## 2017-05-21 NOTE — Progress Notes (Addendum)
Subjective:    Patient ID: [name], male    DOB: 07-20-1946, 71 y.o.   MRN: [id number] Chief Complaint  Patient presents with  . Heart Problem    yesterday per pt sweating alot last night and actually feels better today than yesterday    HPI  H was feeling poorly yesterday and did not sleep last night.  Was chilled and then had night sweats last night all evening - had to change his night short.  Feels a little ill. A little better today but his wife and staff were worried. 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  Did have full w/u prior. Severe gerd/HH.  He is still on prilosec once a day.   Staff noted heavy breathing after a lot of exertion today so they were worried about him and they (and his daughter with whom he works) made him come in for eval.  He thinks he has little virus but is humering them. No n/v.  Tolerating po well - steak and tea for lunch.  Activity level normal but a little reduced from his baseline today - just a little peaked. Large protate so urinary frquency at baseline without sig change. Normal GI. Takes mvi.  Tramadol for vertebral pain - had injection.  Did take ibuprofen last night for HA but no otc nsaids.   Past Medical History:  Diagnosis Date  . Allergic rhinitis   . Arthritis   . Esophageal stricture    EGD 11/07.  stricture dilated.  hiatal hernia, gastritis  . GERD (gastroesophageal reflux disease)    Past Surgical History:  Procedure Laterality Date  . SKIN CANCER EXCISION     Current Outpatient Prescriptions on File Prior to Visit  Medication Sig Dispense Refill  . cholecalciferol (VITAMIN D) 1000 UNITS tablet Take  1,000 Units by mouth daily.    . Cyanocobalamin (VITAMIN B-12 CR PO) Take 1 tablet by mouth daily. Reported on 12/05/2015    . Multiple Vitamins-Minerals (CENTRUM SILVER PO) Take one by mouth daily     . omeprazole (PRILOSEC OTC) 20 MG tablet Take 20 mg by mouth daily.    . traMADol (ULTRAM) 50 MG tablet Take 50 mg by mouth at bedtime.  0   No current facility-administered medications on file prior to visit.    No Known Allergies Family History  Problem Relation Age of Onset  . Hypertension Mother   . Stroke Father   . Colon cancer Neg Hx    Social History   Social History  . Marital status: Married    Spouse name: N/A  . Number of children: 2  . Years of education: N/A   Occupational History  . Attorney    Social History Main Topics  . Smoking status: Never Smoker  . Smokeless tobacco: Never Used  . Alcohol use No  . Drug use: No  . Sexual activity: Not Asked   Other Topics Concern  . None   Social History Narrative   Regular exercise- walks 3-4 times per week, lifts weights, bicycling 10 miles every other day.   Diet- 3 meals ,  lots of fluids, stopped Coke, changed to green tea.     Seen by Dr. Fletcher Anon and Dr. Rockey Situ cardiology  prior Dr. Fletcher Anon noticed a faint murmur but pt unaware of murmur prior and not otherwise noted in chart.   Review of Systems See hpi    Objective:   Physical Exam  Constitutional: He is oriented to person, place, and time. He appears well-developed and well-nourished. No distress.  HENT:  Head: Normocephalic and atraumatic.  Right Ear: Tympanic membrane, external ear and ear canal normal.  Left Ear: Tympanic membrane, external ear and ear canal normal.  Nose: Nose normal.  Mouth/Throat: Oropharynx is clear and moist and mucous membranes are normal. No oropharyngeal exudate.  Eyes: Conjunctivae are normal. No scleral icterus.  Neck: Normal range of motion. Neck supple. No thyromegaly present.  Cardiovascular: Normal rate, regular rhythm  and intact distal pulses.   Murmur (3/6 LUSB) heard. Pulmonary/Chest: Effort normal and breath sounds normal. No respiratory distress.  Ambulated around the small clinic square twice at a good pace with myself while talking. Pt did appear mildly diaphoretic but denied any symptoms and was able to pleasantly converse in full sentences with no DOE. O2 sat remains in high 90s with pulse in the 80s during rapid (though short) ambulation.  Abdominal: Soft. Bowel sounds are normal. He exhibits no distension and no mass. There is no tenderness. There is no rebound and no guarding.  Musculoskeletal: He exhibits no edema.  Lymphadenopathy:    He has no cervical adenopathy.  Neurological: He is alert and oriented to person, place, and time.  Skin: Skin is warm and dry. He is not diaphoretic. No erythema.  Psychiatric: He has a normal mood and affect. His behavior is normal.    BP (!) 166/89 (BP Location: Right Arm, Patient Position: Sitting, Cuff Size: Normal)   Pulse 84   Temp 98.5 F (36.9 C) (Oral)   Resp 16   Ht 5' 6.5" (1.689 m)   Wt 194 lb 9.6 oz (88.3 kg)   SpO2 96%   BMI 30.94 kg/m   [facility] reading (PRIMARY) by  Dr. Brigitte Pulse. EKG: NSR, no acute ischemic change, more prominent S waves in the lateral chest leads but otherwise unchanged compared to 06/13/2013  Orthostatic vital signs: Negative. BP remained 150s/80s with heart rate in 70s.  Echocardiogram 11/29/2012 showed mild mitral and aortic insuffiency.  Results for orders placed or performed in visit on 05/21/17  POCT CBC  Result Value Ref Range   WBC 6.4 4.6 - 10.2 K/uL   Lymph, poc 1.6 0.6 - 3.4   POC LYMPH PERCENT 25.6 10 - 50 %L   MID (cbc) 0.6 0 - 0.9   POC MID % 8.7 0 - 12 %M   POC Granulocyte 4.2 2 - 6.9   Granulocyte percent 65.7 37 - 80 %G   RBC 4.85 4.69 - 6.13 M/uL   Hemoglobin 15.2 14.1 - 18.1 g/dL   HCT, POC 44.4 43.5 - 53.7 %   MCV 91.5 80 - 97 fL   MCH, POC 31.3 (A) 27 - 31.2 pg   MCHC 34.1 31.8 - 35.4 g/dL    RDW, POC 13.3 %   Platelet Count, POC 185 142 - 424 K/uL   MPV 7.0 0 - 99.8 fL  POCT urinalysis dipstick  Result Value Ref Range   Color, UA yellow yellow   Clarity, UA clear clear   Glucose, UA negative negative mg/dL   Bilirubin, UA negative negative  Ketones, POC UA negative negative mg/dL   Spec Grav, UA 1.025 1.010 - 1.025   Blood, UA small (A) negative   pH, UA 6.0 5.0 - 8.0   Protein Ur, POC negative negative mg/dL   Urobilinogen, UA 0.2 0.2 or 1.0 E.U./dL   Nitrite, UA Negative Negative   Leukocytes, UA Negative Negative       Assessment & Plan:   1. Undiagnosed cardiac murmurs - no sign of unstable angina or ACS on exam today.Stat troponin negative. His history last night and this morning is really quite unimpressive as far as cardiac red flags go and his clinical evaluation, vitals with ambulation and movement, EKG, and labs are all benign.  I highly suspect his episode of sweats last night with the accompanying heartburn and some slight decrease in his normal activity level this morning likely due to GERD or possibly a mild virus that he is fighting off.    However, patient has only been seen by a physician for a handful of acute, noncardiac-related concerns over the past several years so it is likely that the murmur has been slowly progressive but he has just not had anyone listen to his heart in several years. Absolutely needs repeat cardiac eval ASAP as does have cardiac risk factors of age, race, sex, obesity, elevated blood pressure, non-compliant with preventative care. Then follow-up with PCP for BP recheck and annual wellness visit. Patient readily agrees to go to ER immediately if any cardiac symptoms occur and return to clinic if any other symptoms continue.   2. Night sweat   3. Elevated blood pressure reading   4. Essential hypertension - mild-mod elevation today. F/u with PCP for med adjustment. Ok to establish here is he would like.     Orders Placed This  Encounter  Procedures  . Comprehensive metabolic panel  . Troponin I  . Ambulatory referral to Cardiology    Referral Priority:   Urgent    Referral Type:   Consultation    Referral Reason:   Specialty Services Required    Requested Specialty:   Cardiology    Number of Visits Requested:   1  . Orthostatic vital signs  . POCT CBC  . POCT urinalysis dipstick  . EKG 12-Lead    Meds ordered this encounter  Medications  . aspirin EC 81 MG tablet    Sig: Take 1 tablet (81 mg total) by mouth daily.  . pantoprazole (PROTONIX) 40 MG tablet    Sig: Take 1 tablet (40 mg total) by mouth daily. 30 minutes before a meal    Dispense:  30 tablet    Refill:  0     Delman Cheadle, M.D.  Primary Care at [facility] [address] [place], [place] [address] [phone] phone [phone] fax  05/24/17 8:18 AM

## 2017-05-21 NOTE — Patient Instructions (Addendum)
F/u with you PCP this week to discuss starting on blood pressure medicine. Double up on your omeprazole or start on the pantoprazole instead. Start an enteric coated aspirin 81mg  daily.  If you get worse or develop any other symptoms, please come back to the office or go to the ER immediately.    IF you received an x-ray today, you will receive an invoice from [facility]. Please contact [facility] at [phone] with questions or concerns regarding your invoice.   IF you received labwork today, you will receive an invoice from [facility]. Please contact [facility] at [phone] with questions or concerns regarding your invoice.   Our billing staff will not be able to assist you with questions regarding bills from these companies.  You will be contacted with the lab results as soon as they are available. The fastest way to get your results is to activate your My Chart account. Instructions are located on the last page of this paperwork. If you have not heard from [place] regarding the results in 2 weeks, please contact this office.     Hypertension Hypertension, commonly called high blood pressure, is when the force of blood pumping through the arteries is too strong. The arteries are the blood vessels that carry blood from the heart throughout the body. Hypertension forces the heart to work harder to pump blood and may cause arteries to become narrow or stiff. Having untreated or uncontrolled hypertension can cause heart attacks, strokes, kidney disease, and other problems. A blood pressure reading consists of a higher number over a lower number. Ideally, your blood pressure should be below 120/80. The first ("top") number is called the systolic pressure. It is a measure of the pressure in your arteries as your heart beats. The second ("bottom") number is called the diastolic pressure. It is a measure of the pressure in your arteries as the heart relaxes. What are the causes? The  cause of this condition is not known. What increases the risk? Some risk factors for high blood pressure are under your control. Others are not. Factors you can change  Smoking.  Having type 2 diabetes mellitus, high cholesterol, or both.  Not getting enough exercise or physical activity.  Being overweight.  Having too much fat, sugar, calories, or salt (sodium) in your diet.  Drinking too much alcohol. Factors that are difficult or impossible to change  Having chronic kidney disease.  Having a family history of high blood pressure.  Age. Risk increases with age.  Race. You may be at higher risk if you are African-American.  Gender. Men are at higher risk than women before age 83. After age 10, women are at higher risk than men.  Having obstructive sleep apnea.  Stress. What are the signs or symptoms? Extremely high blood pressure (hypertensive crisis) may cause:  Headache.  Anxiety.  Shortness of breath.  Nosebleed.  Nausea and vomiting.  Severe chest pain.  Jerky movements you cannot control (seizures).  How is this diagnosed? This condition is diagnosed by measuring your blood pressure while you are seated, with your arm resting on a surface. The cuff of the blood pressure monitor will be placed directly against the skin of your upper arm at the level of your heart. It should be measured at least twice using the same arm. Certain conditions can cause a difference in blood pressure between your right and left arms. Certain factors can cause blood pressure readings to be lower or higher than normal (elevated) for a  short period of time:  When your blood pressure is higher when you are in a health care provider's office than when you are at home, this is called white coat hypertension. Most people with this condition do not need medicines.  When your blood pressure is higher at home than when you are in a health care provider's office, this is called masked  hypertension. Most people with this condition may need medicines to control blood pressure.  If you have a high blood pressure reading during one visit or you have normal blood pressure with other risk factors:  You may be asked to return on a different day to have your blood pressure checked again.  You may be asked to monitor your blood pressure at home for 1 week or longer.  If you are diagnosed with hypertension, you may have other blood or imaging tests to help your health care provider understand your overall risk for other conditions. How is this treated? This condition is treated by making healthy lifestyle changes, such as eating healthy foods, exercising more, and reducing your alcohol intake. Your health care provider may prescribe medicine if lifestyle changes are not enough to get your blood pressure under control, and if:  Your systolic blood pressure is above 130.  Your diastolic blood pressure is above 80.  Your personal target blood pressure may vary depending on your medical conditions, your age, and other factors. Follow these instructions at home: Eating and drinking  Eat a diet that is high in fiber and potassium, and low in sodium, added sugar, and fat. An example eating plan is called the DASH (Dietary Approaches to Stop Hypertension) diet. To eat this way: ? Eat plenty of fresh fruits and vegetables. Try to fill half of your plate at each meal with fruits and vegetables. ? Eat whole grains, such as whole wheat pasta, brown rice, or whole grain bread. Fill about one quarter of your plate with whole grains. ? Eat or drink low-fat dairy products, such as skim milk or low-fat yogurt. ? Avoid fatty cuts of meat, processed or cured meats, and poultry with skin. Fill about one quarter of your plate with lean proteins, such as fish, chicken without skin, beans, eggs, and tofu. ? Avoid premade and processed foods. These tend to be higher in sodium, added sugar, and  fat.  Reduce your daily sodium intake. Most people with hypertension should eat less than 1,500 mg of sodium a day.  Limit alcohol intake to no more than 1 drink a day for nonpregnant women and 2 drinks a day for men. One drink equals 12 oz of beer, 5 oz of wine, or 1 oz of hard liquor. Lifestyle  Work with your health care provider to maintain a healthy body weight or to lose weight. Ask what an ideal weight is for you.  Get at least 30 minutes of exercise that causes your heart to beat faster (aerobic exercise) most days of the week. Activities may include walking, swimming, or biking.  Include exercise to strengthen your muscles (resistance exercise), such as pilates or lifting weights, as part of your weekly exercise routine. Try to do these types of exercises for 30 minutes at least 3 days a week.  Do not use any products that contain nicotine or tobacco, such as cigarettes and e-cigarettes. If you need help quitting, ask your health care provider.  Monitor your blood pressure at home as told by your health care provider.  Keep all follow-up visits as  told by your health care provider. This is important. Medicines  Take over-the-counter and prescription medicines only as told by your health care provider. Follow directions carefully. Blood pressure medicines must be taken as prescribed.  Do not skip doses of blood pressure medicine. Doing this puts you at risk for problems and can make the medicine less effective.  Ask your health care provider about side effects or reactions to medicines that you should watch for. Contact a health care provider if:  You think you are having a reaction to a medicine you are taking.  You have headaches that keep coming back (recurring).  You feel dizzy.  You have swelling in your ankles.  You have trouble with your vision. Get help right away if:  You develop a severe headache or confusion.  You have unusual weakness or numbness.  You  feel faint.  You have severe pain in your chest or abdomen.  You vomit repeatedly.  You have trouble breathing. Summary  Hypertension is when the force of blood pumping through your arteries is too strong. If this condition is not controlled, it may put you at risk for serious complications.  Your personal target blood pressure may vary depending on your medical conditions, your age, and other factors. For most people, a normal blood pressure is less than 120/80.  Hypertension is treated with lifestyle changes, medicines, or a combination of both. Lifestyle changes include weight loss, eating a healthy, low-sodium diet, exercising more, and limiting alcohol. This information is not intended to replace advice given to you by your health care provider. Make sure you discuss any questions you have with your health care provider. Document Released: 11/02/2005 Document Revised: 09/30/2016 Document Reviewed: 09/30/2016 Elsevier Interactive Patient Education  [employer].

## 2017-05-26 ENCOUNTER — Telehealth: Payer: Self-pay | Admitting: Family Medicine

## 2017-05-26 NOTE — Telephone Encounter (Signed)
Dr. Brigitte Pulse is this okay ?

## 2017-05-26 NOTE — Telephone Encounter (Signed)
Please see Dr Shaw's message °

## 2017-05-26 NOTE — Telephone Encounter (Signed)
Spoke with [facility] to see about getting urgent referral for pt scheduled for Dr. Fletcher Anon and Dr. Rockey Situ. Both of these doctors are scheduling out in September even for urgent referrals. The scheduler asked if we would like their next available so I went ahead and took this appt. It is at their [facility] location on Monday 7/16 at 10:20. Is this appt soon enough or do we want to try another office? I will call the pt and let him know about this appt for now and if we decide to refer elsewhere I will call him back. Please advise.

## 2017-05-26 NOTE — Telephone Encounter (Signed)
That should be fine - please let pt know. If he chooses to decline that appointment, then he should f/u with his PCP asap for further referral/management.   Please make sure pt's PCP Dr. Diona Browner with [facility] gets faxed a copy of my note, EKG, labs, and this referral info.   Thanks.

## 2017-05-27 DIAGNOSIS — R972 Elevated prostate specific antigen [PSA]: Secondary | ICD-10-CM | POA: Diagnosis not present

## 2017-05-27 NOTE — Telephone Encounter (Addendum)
Yes, please tell pt that I would be honored to see him.  I did not see any sign of impending/urgent heart problems at our visit so in my opinion it is fine to wait until 8/20 to see Dr. Zella Richer as long as he is feeling back to his normal baseline self by now. If he is not, follow-up with me in the office asap.  Otherwise, I will look forward to seeing him at a visit to establish care and review his chronic medical conditions at some point before he needs refills of his meds.

## 2017-05-27 NOTE — Telephone Encounter (Signed)
Pt called and said he was hoping he could be seen by his cardiologists with [facility] in [place]. I let him know of Dr. Raul Del message that if he ended up declining his appt on Monday, he needed to follow up with his PCP asap. He wanted to know if Dr. Brigitte Pulse was taking new patients if he could come see her instead of his other PCP if he declined this appt. I told him I would put a message in concerning this. Please advise. Pt said he would like to be reached on work number between 9-5 at [phone] and would be home after 6:30.

## 2017-05-27 NOTE — Telephone Encounter (Signed)
Dr. Brigitte Pulse patient would like to make you his PCP. Is this okay ?

## 2017-05-27 NOTE — Telephone Encounter (Signed)
Pt called and said he is scheduled on 8/20 with Dr. Zella Richer and wanted to know if it was okay to go to this appt rather than 7/16. He still would like to know if he could come here as well for Dr. Brigitte Pulse to be PCP.

## 2017-05-27 NOTE — Telephone Encounter (Signed)
Please advise 

## 2017-05-28 ENCOUNTER — Ambulatory Visit: Payer: Medicare Other | Admitting: Family Medicine

## 2017-05-28 NOTE — Telephone Encounter (Signed)
Left message on VM advising that it was okay to keep appointment on 08/20 with Dr. Zella Richer unless he felt that he needed to see Dr. Brigitte Pulse sooner and other than that he was to call back and schedule an establish care appointment with Dr. Brigitte Pulse at his earliest convenience.

## 2017-05-31 ENCOUNTER — Ambulatory Visit: Payer: Medicare Other | Admitting: Cardiology

## 2017-06-03 DIAGNOSIS — R972 Elevated prostate specific antigen [PSA]: Secondary | ICD-10-CM | POA: Diagnosis not present

## 2017-07-04 NOTE — Progress Notes (Signed)
Cardiology Office Note  Date:  07/05/2017   ID:  [name], DOB 1946-04-10, MRN [id number]  PCP:  Shawnee Knapp, MD   Chief Complaint  Patient presents with  . other    Ref by Dr. Brigitte Pulse for HTN and undiagnosed cardiac murmur, pt. was seen in 2014 by Dr. Rockey Situ. Meds reviewed by the pt. verbally.     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  Previous chest burning after using his dumbbells.Musculoskeletal d/o Treated with NSAIDS.  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   No smoking, no diabetes, No recent cholesterol. LDL 137 five years ago  Previous echo in 2016 with no significant valve disease. mild AI and MR  EKG shows normal sinus rhythm with rate 62 beats per minute with no significant ST or T wave changes  PMH:   has a past medical history of Actinic keratosis (11/30/2007); Allergic rhinitis; ALLERGIC RHINITIS (11/30/2007); Arthritis; Burning chest pain (12/02/2011); Chest pain (11/26/2011); Esophageal stricture; FATIGUE, CHRONIC (09/10/2008); GERD (11/30/2007); GERD (gastroesophageal reflux disease); High cholesterol (11/27/2011); Hyperlipidemia (06/13/2013); Murmur, cardiac (11/04/2012); NEOPLASM, SKIN, UNCERTAIN BEHAVIOR (4/0/9811); OBESITY (11/30/2007); Prediabetes (11/27/2011); ROTATOR CUFF SYNDROME (11/30/2007); SKIN LESION, BENIGN (02/15/2008); SYNCOPE, HX OF (11/30/2007); and Tick bite (04/14/2016).  PSH:    Past Surgical History:  Procedure Laterality Date  . SKIN CANCER EXCISION      Current Outpatient Prescriptions  Medication Sig  Dispense Refill  . aspirin EC 81 MG tablet Take 1 tablet (81 mg total) by mouth daily.    . cholecalciferol (VITAMIN D) 1000 UNITS tablet Take 1,000 Units by mouth daily.    . Cyanocobalamin (VITAMIN B-12 CR PO) Take 1 tablet by mouth daily. Reported on 12/05/2015    . Multiple Vitamins-Minerals (CENTRUM SILVER PO) Take one by mouth daily     . pantoprazole (PROTONIX) 40 MG tablet Take 1 tablet (40 mg total) by mouth daily. 30 minutes before a meal 30 tablet 0  . traMADol (ULTRAM) 50 MG tablet Take 50 mg by mouth at bedtime.  0   No current facility-administered medications for this visit.      Allergies:   Patient has no known allergies.   Social History:  The patient  reports that he has never smoked. He has never used smokeless tobacco. He reports that he does not drink alcohol or use drugs.   Family History:   family history includes Hypertension in his mother; Stroke in his father.    Review of Systems: Review of Systems  Constitutional: Negative.   Respiratory: Negative.   Cardiovascular: Negative.   Gastrointestinal: Negative.   Musculoskeletal: Negative.   Neurological: Negative.   Psychiatric/Behavioral: Negative.   All other systems reviewed and are negative.    PHYSICAL EXAM: VS:  BP 120/60 (BP Location: Left Arm, Patient Position: Sitting, Cuff Size: Normal)   Pulse 65   Ht 5' 6.5" (1.689 m)   Wt 188 lb (85.3 kg)   BMI 29.89 kg/m  , BMI Body mass index is 29.89 kg/m. GEN: Well nourished, well developed, in no  acute distress  HEENT: normal  Neck: no JVD, carotid bruits, or masses Cardiac: RRR; I-II/6 SEM LSB, no rubs, or gallops,no edema  Respiratory:  clear to auscultation bilaterally, normal work of breathing GI: soft, nontender, nondistended, + BS MS: no deformity or atrophy  Skin: warm and dry, no rash Neuro:  Strength and sensation are intact Psych: euthymic mood, full affect   Recent Labs: 05/21/2017: ALT 38; BUN 13; Creatinine, Ser 0.89; Hemoglobin  15.2; Potassium 4.2; Sodium 142    Lipid Panel Lab Results  Component Value Date   CHOL 203 (H) 11/27/2011   HDL 48.00 11/27/2011   TRIG 95.0 11/27/2011      Wt Readings from Last 3 Encounters:  07/05/17 188 lb (85.3 kg)  05/21/17 194 lb 9.6 oz (88.3 kg)  04/14/16 188 lb 4 oz (85.4 kg)       ASSESSMENT AND PLAN:  Chest pain, unspecified type - Plan: EKG 12-Lead Previous musculoskeletal d/o from using weights, none recently Discussed risk stratification imaging techniques with him, We have ordered a CT coronary calcium score  Mixed hyperlipidemia - Plan: EKG 12-Lead LDL previous elevated, CT coroncary calcium score as detailed above to guide cholesterol management  Murmur, cardiac - Plan: EKG 12-Lead Low grade murmur, likely mild to moderate AI. Also with mild MR on previous echo. As murmur low grade and no sx, will hold off on repeat echo and monitor for now.  Disposition:   F/U  As needed   Total encounter time more than 25 minutes  Greater than 50% was spent in counseling and coordination of care with the patient    Orders Placed This Encounter  Procedures  . EKG 12-Lead     Signed, Esmond Plants, M.D., Ph.D. 07/05/2017  [place], [place]

## 2017-07-05 ENCOUNTER — Encounter: Payer: Self-pay | Admitting: Cardiovascular Disease

## 2017-07-05 ENCOUNTER — Ambulatory Visit (INDEPENDENT_AMBULATORY_CARE_PROVIDER_SITE_OTHER): Payer: Medicare Other | Admitting: Cardiovascular Disease

## 2017-07-05 VITALS — BP 110/60 | HR 65 | Ht 66.5 in | Wt 188.0 lb

## 2017-07-05 DIAGNOSIS — R079 Chest pain, unspecified: Secondary | ICD-10-CM

## 2017-07-05 DIAGNOSIS — Z8249 Family history of ischemic heart disease and other diseases of the circulatory system: Secondary | ICD-10-CM

## 2017-07-05 DIAGNOSIS — E782 Mixed hyperlipidemia: Secondary | ICD-10-CM

## 2017-07-05 DIAGNOSIS — R011 Cardiac murmur, unspecified: Secondary | ICD-10-CM

## 2017-07-05 NOTE — Patient Instructions (Addendum)
Medication Instructions:   No medication changes made  Labwork:  No new labs needed  Testing/Procedures:  We will order CT coronary calcium score For family hx Please call [phone] to schedule this at your convenience [place] There is a one-time fee of $150 due at the time of your procedure  Follow-Up: It was a pleasure seeing you in the office today. Please call us if you have new issues that need to be addressed before your next appt.  [phone]  Your physician wants you to follow-up in As needed  If you need a refill on your cardiac medications before your next appointment, please call your pharmacy.     Coronary Calcium Scan A coronary calcium scan is an imaging test used to look for deposits of calcium and other fatty materials (plaques) in the inner lining of the blood vessels of the heart (coronary arteries). These deposits of calcium and plaques can partly clog and narrow the coronary arteries without producing any symptoms or warning signs. This puts a person at risk for a heart attack. This test can detect these deposits before symptoms develop. Tell a health care provider about:  Any allergies you have.  All medicines you are taking, including vitamins, herbs, eye drops, creams, and over-the-counter medicines.  Any problems you or family members have had with anesthetic medicines.  Any blood disorders you have.  Any surgeries you have had.  Any medical conditions you have.  Whether you are pregnant or may be pregnant. What are the risks? Generally, this is a safe procedure. However, problems may occur, including:  Harm to a pregnant woman and her unborn baby. This test involves the use of radiation. Radiation exposure can be dangerous to a pregnant woman and her unborn baby. If you are pregnant, you generally should not have this procedure done.  Slight increase in the risk of cancer. This is because of the  radiation involved in the test.  What happens before the procedure? No preparation is needed for this procedure. What happens during the procedure?  You will undress and remove any jewelry around your neck or chest.  You will put on a hospital gown.  Sticky electrodes will be placed on your chest. The electrodes will be connected to an electrocardiogram (ECG) machine to record a tracing of the electrical activity of your heart.  A CT scanner will take pictures of your heart. During this time, you will be asked to lie still and hold your breath for 2-3 seconds while a picture of your heart is being taken. The procedure may vary among health care providers and hospitals. What happens after the procedure?  You can get dressed.  You can return to your normal activities.  It is up to you to get the results of your test. Ask your health care provider, or the department that is doing the test, when your results will be ready. Summary  A coronary calcium scan is an imaging test used to look for deposits of calcium and other fatty materials (plaques) in the inner lining of the blood vessels of the heart (coronary arteries).  Generally, this is a safe procedure. Tell your health care provider if you are pregnant or may be pregnant.  No preparation is needed for this procedure.  A CT scanner will take pictures of your heart.  You can return to your normal activities after the scan is done. This information is not intended to replace advice given to you  by your health care provider. Make sure you discuss any questions you have with your health care provider. Document Released: 04/30/2008 Document Revised: 09/21/2016 Document Reviewed: 09/21/2016 Elsevier Interactive Patient Education  2017 [employer].

## 2017-07-14 ENCOUNTER — Ambulatory Visit (INDEPENDENT_AMBULATORY_CARE_PROVIDER_SITE_OTHER)
Admission: RE | Admit: 2017-07-14 | Discharge: 2017-07-14 | Disposition: A | Discharge status: Home / Self Care | Payer: Self-pay | Source: Ambulatory Visit | Attending: Cardiovascular Disease | Admitting: Cardiovascular Disease

## 2017-07-14 DIAGNOSIS — R011 Cardiac murmur, unspecified: Secondary | ICD-10-CM

## 2017-07-14 DIAGNOSIS — R079 Chest pain, unspecified: Secondary | ICD-10-CM

## 2017-07-14 DIAGNOSIS — Z8249 Family history of ischemic heart disease and other diseases of the circulatory system: Secondary | ICD-10-CM

## 2017-07-14 DIAGNOSIS — E782 Mixed hyperlipidemia: Secondary | ICD-10-CM

## 2017-07-24 ENCOUNTER — Other Ambulatory Visit: Payer: Self-pay | Admitting: Family Medicine

## 2017-08-18 DIAGNOSIS — Z23 Encounter for immunization: Secondary | ICD-10-CM | POA: Diagnosis not present

## 2017-09-16 DIAGNOSIS — L821 Other seborrheic keratosis: Secondary | ICD-10-CM | POA: Diagnosis not present

## 2017-09-16 DIAGNOSIS — L814 Other melanin hyperpigmentation: Secondary | ICD-10-CM | POA: Diagnosis not present

## 2017-09-16 DIAGNOSIS — D229 Melanocytic nevi, unspecified: Secondary | ICD-10-CM | POA: Diagnosis not present

## 2017-09-22 ENCOUNTER — Telehealth: Payer: Self-pay

## 2017-09-22 NOTE — Telephone Encounter (Signed)
Called pt to schedule Medicare Annual Wellness Visit with Nurse Health Advisor.  Josepha Pigg, B.A.  Care Guide - [facility]

## 2018-02-28 ENCOUNTER — Encounter: Payer: Self-pay | Admitting: Internal Medicine

## 2018-02-28 ENCOUNTER — Ambulatory Visit (INDEPENDENT_AMBULATORY_CARE_PROVIDER_SITE_OTHER): Payer: Medicare Other

## 2018-02-28 ENCOUNTER — Other Ambulatory Visit
Admission: RE | Admit: 2018-02-28 | Discharge: 2018-02-28 | Disposition: A | Discharge status: Home / Self Care | Payer: Medicare Other | Source: Ambulatory Visit | Attending: Cardiovascular Disease | Admitting: Cardiovascular Disease

## 2018-02-28 ENCOUNTER — Telehealth: Payer: Self-pay | Admitting: Cardiovascular Disease

## 2018-02-28 ENCOUNTER — Other Ambulatory Visit: Payer: Self-pay

## 2018-02-28 ENCOUNTER — Encounter: Payer: Self-pay | Admitting: Nurse Practitioner

## 2018-02-28 VITALS — BP 122/70 | HR 70 | Resp 16

## 2018-02-28 DIAGNOSIS — R079 Chest pain, unspecified: Secondary | ICD-10-CM

## 2018-02-28 LAB — TROPONIN I

## 2018-02-28 NOTE — Telephone Encounter (Signed)
Reviewed call with Dr. Rockey Situ who recommends EKG and troponin.  I s/w patient's wife, who is agreeable with 3:30pm labs at the [facility] and 4pm EKG in our office. Added to schedule. Orders placed.

## 2018-02-28 NOTE — Telephone Encounter (Signed)
EKG look normal Troponin normal Can we follow-up with phone call on Tuesday to make sure symptoms are improving If no improvement may need to get him on the schedule at some point

## 2018-02-28 NOTE — Patient Instructions (Addendum)
1.) Reason for visit: EKG  2.) Name of MD requesting visit: Dr. Rockey Situ        3.) H&P: GERD; chest burning when lying supine, felt to be from musculoskeletal etiology, treated with long tapering course of NSAIDs with improvement   4.) ROS related to problem: Patient reports chest pain x 2 days which awakened him at 3am Monday. Denies any other symptoms. Pain reported when he presses on chest area.  5.) Assessment and plan per MD: EKG showing NSR. Troponin negative. Reviewed with Dr. Rockey Situ who recommends ibuprofen 600mg  TID. Patient may call tomorrow or later this week to report if symptoms have improved.   Reviewed with patient who verbalized understanding and is agreeable with plan.

## 2018-02-28 NOTE — Telephone Encounter (Signed)
Pt's wife called to report pt has had left-sided chest pain x 2 days.  Wife denies patient has had nausea, diaphoresis, jaw pain, SOB. He has a history of pericarditis and has been taking NSAIDs with little relief. Wife observed patient rubbing his chest multiple times yesterday. He has been awake since 3am, stating he couldn't sleep and was having burning bilateral arms. Symptoms improve when sitting up.  Per wife, patient is at work today. He is an attorney with a high stress job.  Patient told his wife "something's not right" but went on to work this morning.   He has a history of low grade cardiac murmur, GERD, chest burning when lying in supine position which was felt to be musculoskeletal. He has been treaded with NSAIDs.  He take pantoprazole for GERD.  He last saw Dr. Rockey Situ August 2018; CT calcium score 0.   Wife is concerned and would like a patient appointment with Dr. Rockey Situ. We reviewed s/s that would require immediate attention but she prefers him to be seen by cardiology. Informed wife I would review with MD and call back. She provided call back numbers:  [phone] ; cell  [phone];  Will review with Dr. Rockey Situ who is DOD today.

## 2018-02-28 NOTE — Telephone Encounter (Signed)
Pt wife calling stating since 3 am this morning, he has been having pains in upper side of chest. He was having some burning in the arms, but she states he does get shots in neck for back pains so this could be related to that  She states they are not sure if what is going on is heart related or any of the other issues that patients has, but would like to rule out if it is the heart.  Please advise

## 2018-03-01 NOTE — Telephone Encounter (Signed)
S/w patient's wife. She says he seems a bit better today though not completely. Patient has appointment with Gastrointestinal around 03/17/18 for further work up and evaluation. She verbalized understanding to call us if he develops any new or worsening symptoms and/or go to ER if needed.

## 2018-03-11 ENCOUNTER — Telehealth: Payer: Self-pay | Admitting: Internal Medicine

## 2018-03-11 NOTE — Telephone Encounter (Signed)
Left message for pt to call back.  Pt states he is having a lot of belching and is not happy about appt having to be rescheduled. Offered pt an appt on 03/21/18 but pt states he cannot come that day. Pt scheduled to see Amy Esterwood PA 03/24/18@2 :30pm. Pt aware of appt.

## 2018-03-17 ENCOUNTER — Ambulatory Visit: Payer: Medicare Other | Admitting: Nurse Practitioner

## 2018-03-24 ENCOUNTER — Ambulatory Visit (INDEPENDENT_AMBULATORY_CARE_PROVIDER_SITE_OTHER): Payer: Medicare Other | Admitting: Physician Assistant

## 2018-03-24 ENCOUNTER — Other Ambulatory Visit: Payer: Self-pay | Admitting: Physician Assistant

## 2018-03-24 ENCOUNTER — Encounter: Payer: Self-pay | Admitting: Physician Assistant

## 2018-03-24 VITALS — BP 138/74 | HR 78 | Ht 66.0 in | Wt 187.0 lb

## 2018-03-24 DIAGNOSIS — R142 Eructation: Secondary | ICD-10-CM | POA: Diagnosis not present

## 2018-03-24 DIAGNOSIS — Z1211 Encounter for screening for malignant neoplasm of colon: Secondary | ICD-10-CM

## 2018-03-24 DIAGNOSIS — K219 Gastro-esophageal reflux disease without esophagitis: Secondary | ICD-10-CM | POA: Diagnosis not present

## 2018-03-24 DIAGNOSIS — R140 Abdominal distension (gaseous): Secondary | ICD-10-CM

## 2018-03-24 DIAGNOSIS — Z86010 Personal history of colonic polyps: Secondary | ICD-10-CM

## 2018-03-24 DIAGNOSIS — R1310 Dysphagia, unspecified: Secondary | ICD-10-CM

## 2018-03-24 MED ORDER — PEG-KCL-NACL-NASULF-NA ASC-C 140 G PO SOLR: 1.0000 | Freq: Once | ORAL | 0 refills | Status: AC

## 2018-03-24 NOTE — Progress Notes (Signed)
Assessment and plans reviewed  

## 2018-03-24 NOTE — Progress Notes (Signed)
Subjective:    Patient ID: [name], male    DOB: 1946-02-09, 72 y.o.   MRN: [id number]  HPI  [name] is a pleasant 72 year old white male, known to Dr. Henrene Pastor with history of chronic GERD, esophageal stricture, diverticulosis and history of adenomatous colon polyps. He comes in today after a recent episode of significant belching burping and lower chest discomfort which was present for a week or so and has since subsided.  He was seen by his cardiologist and had cardiac etiologies ruled out.  He then made appointment here to see if symptoms were related to acid reflux or other GI issue. He is maintained chronically on OTC Prilosec 20 mg and says this generally controls his reflux symptoms well.  He has not been having any odynophagia but will on occasion have some mild sensation of dysphagia.  He did not have any associated nausea or vomiting with that episode, no abdominal pain, no fever or chills.  His appetite was okay and his weight has been stable.  He also denies any changes in medications, no recent antibiotics etc.  No changes in bowel habits melena or hematochezia. EGD was done in March 2014 showing a small hiatal hernia and a 15 mm stricture at the GE junction was Maloney dilated to 75.  Colonoscopy March 2014 done for prior history of adenomatous polyps showed mild diverticulosis and one diminutive sigmoid polyp removed which was hyperplastic.  He was indicated for 5-year interval follow-up.  Review of Systems Pertinent positive and negative review of systems were noted in the above HPI section.  All other review of systems was otherwise negative.  Outpatient Encounter Medications as of 03/24/2018  Medication Sig  . omeprazole (PRILOSEC) 10 MG capsule Take 10 mg by mouth daily.  . traMADol (ULTRAM) 50 MG tablet Take 50 mg by mouth at bedtime.  Marland Kitchen PEG-KCl-NaCl-NaSulf-Na Asc-C (PLENVU) 140 g SOLR Take 1 kit by mouth once for 1 dose.  . [DISCONTINUED] aspirin EC 81 MG tablet Take 1  tablet (81 mg total) by mouth daily. (Patient not taking: Reported on 02/28/2018)  . [DISCONTINUED] cholecalciferol (VITAMIN D) 1000 UNITS tablet Take 1,000 Units by mouth daily.  . [DISCONTINUED] Cyanocobalamin (VITAMIN B-12 CR PO) Take 1 tablet by mouth daily. Reported on 12/05/2015  . [DISCONTINUED] Multiple Vitamins-Minerals (CENTRUM SILVER PO) Take one by mouth daily   . [DISCONTINUED] pantoprazole (PROTONIX) 40 MG tablet TAKE 1 TABLET (40 MG TOTAL) BY MOUTH DAILY. [place]   No facility-administered encounter medications on file as of 03/24/2018.    No Known Allergies Patient Active Problem List   Diagnosis Date Noted  . Hx of adenomatous colonic polyps 03/24/2018  . Tick bite 04/14/2016  . Hyperlipidemia 06/13/2013  . Murmur, cardiac 11/04/2012  . Burning chest pain 12/02/2011  . High cholesterol 11/27/2011  . Prediabetes 11/27/2011  . Chest pain 11/26/2011  . FATIGUE, CHRONIC 09/10/2008  . NEOPLASM, SKIN, UNCERTAIN BEHAVIOR 85/27/7824  . SKIN LESION, BENIGN 02/15/2008  . OBESITY 11/30/2007  . ALLERGIC RHINITIS 11/30/2007  . GERD 11/30/2007  . ACTINIC KERATOSIS 11/30/2007  . ROTATOR CUFF SYNDROME 11/30/2007  . SYNCOPE, HX OF 11/30/2007   Social History   Socioeconomic History  . Marital status: Married    Spouse name: Not on file  . Number of children: 2  . Years of education: Not on file  . Highest education level: Not on file  Occupational History  . Occupation: Occupational hygienist  . Emergency planning/management officer  strain: Not on file  . Food insecurity:    Worry: Not on file    Inability: Not on file  . Transportation needs:    Medical: Not on file    Non-medical: Not on file  Tobacco Use  . Smoking status: Never Smoker  . Smokeless tobacco: Never Used  Substance and Sexual Activity  . Alcohol use: No  . Drug use: No  . Sexual activity: Not on file  Lifestyle  . Physical activity:    Days per week: Not on file    Minutes per session: Not on file    . Stress: Not on file  Relationships  . Social connections:    Talks on phone: Not on file    Gets together: Not on file    Attends religious service: Not on file    Active member of club or organization: Not on file    Attends meetings of clubs or organizations: Not on file    Relationship status: Not on file  . Intimate partner violence:    Fear of current or ex partner: Not on file    Emotionally abused: Not on file    Physically abused: Not on file    Forced sexual activity: Not on file  Other Topics Concern  . Not on file  Social History Narrative   Regular exercise- walks 3-4 times per week, lifts weights, bicycling 10 miles every other day.   Diet- 3 meals , lots of fluids, stopped Coke, changed to green tea.    Mr. Kauth family history includes Hypertension in his mother; Stroke in his father.      Objective:    Vitals:   03/24/18 1435  BP: 138/74  Pulse: 78    Physical Exam; well-developed older white male, no acute distress, blood pressure 138/74 pulse 78, height 5 foot 6, weight 187, BMI of 30.1.  HEENT; nontraumatic normocephalic EOMI PERRLA sclera anicteric, Oropharynx benign, Cardiovascular; regular rate and rhythm with K3-T4 is a soft systolic murmur, Pulmonary; clear bilaterally, Abdomen ;soft, nontender nondistended bowel sounds are active there is no palpable mass or hepatosplenomegaly, Rectal; exam not done, Extremities; no clubbing cyanosis or edema skin warm and dry, Neuro psych; alert and oriented, grossly nonfocal mood and affect appropriate       Assessment & Plan:   #49 72 year old white male with history of chronic GERD and peptic stricture with previous esophageal dilation who presents after a recent episode of increased belching and burping and lower chest discomfort which was present for a week or so and has since resolved. Patient is seen his cardiologist, not felt to be cardiac. He may have had an exacerbation of acid reflux or possibly  some esophagitis.  Consider biliary colic though he had no epigastric or abdominal pain.  #2 vague dysphagia rule out recurrent stricture #3.  History of adenomatous colon polyps due for follow-up colonoscopy.  Patient had 4 adenomatous polyps removed at colonoscopy 2009, colonoscopy March 2014 no recurrent adenomatous polyps, one small hyperplastic polyp removed. #4.  Diverticulosis  #5 hyperlipidemia  Plan; will tighten up on antireflux regimen, patient was given an antireflux diet, and antireflux precautions. For now continue omeprazole 20 mg p.o. every morning.  Patient was advised should he have recurrence in symptoms to increase his Prilosec to 20 mg p.o. twice daily. Check CBC and CMET today Patient advised to call back should he have recurrence of symptoms and would also consider upper abdominal ultrasound at that time. He will be scheduled  for upper endoscopy with possible esophageal dilation with Dr. Sallee Provencal will be scheduled for Colonoscopy.  Both procedures were discussed in detail with the patient including indications risks and benefits and he is agreeable to proceed.  Amy S Esterwood PA-C 03/24/2018   Cc: Shawnee Knapp, MD

## 2018-03-24 NOTE — Patient Instructions (Addendum)
If you are age 73 or older, your body mass index should be between 23-30. Your Body mass index is 30.18 kg/m. If this is out of the aforementioned range listed, please consider follow up with your Primary Care Provider.  Your provider has requested that you go to the basement level for lab work before leaving today. Press "B" on the elevator. The lab is located at the first door on the left as you exit the elevator.  Continue Prilosec 20 mg, take 1 tab by mouth every morning. If symptoms recur increase Prilosec to 20 mg by mouth twice daily.  You have been scheduled for an endoscopy and colonoscopy. Please follow the written instructions given to you at your visit today. Please pick up your prep supplies at the pharmacy within the next 1-3 days. [name]  If you use inhalers (even only as needed), please bring them with you on the day of your procedure. Your physician has requested that you go to www.startemmi.com and enter the access code given to you at your visit today. This web site gives a general overview about your procedure. However, you should still follow specific instructions given to you by our office regarding your preparation for the procedure.

## 2018-03-31 DIAGNOSIS — M5030 Other cervical disc degeneration, unspecified cervical region: Secondary | ICD-10-CM | POA: Diagnosis not present

## 2018-04-13 ENCOUNTER — Encounter: Payer: Self-pay | Admitting: Family Medicine

## 2018-05-04 DIAGNOSIS — K219 Gastro-esophageal reflux disease without esophagitis: Secondary | ICD-10-CM | POA: Diagnosis not present

## 2018-05-04 DIAGNOSIS — K222 Esophageal obstruction: Secondary | ICD-10-CM | POA: Diagnosis not present

## 2018-05-04 DIAGNOSIS — Z86010 Personal history of colonic polyps: Secondary | ICD-10-CM | POA: Diagnosis not present

## 2018-05-10 DIAGNOSIS — M5030 Other cervical disc degeneration, unspecified cervical region: Secondary | ICD-10-CM | POA: Diagnosis not present

## 2018-05-30 ENCOUNTER — Encounter: Payer: Medicare Other | Admitting: Internal Medicine

## 2018-05-31 DIAGNOSIS — D122 Benign neoplasm of ascending colon: Secondary | ICD-10-CM | POA: Diagnosis not present

## 2018-05-31 DIAGNOSIS — K5730 Diverticulosis of large intestine without perforation or abscess without bleeding: Secondary | ICD-10-CM | POA: Diagnosis not present

## 2018-05-31 DIAGNOSIS — D123 Benign neoplasm of transverse colon: Secondary | ICD-10-CM | POA: Diagnosis not present

## 2018-05-31 DIAGNOSIS — K635 Polyp of colon: Secondary | ICD-10-CM | POA: Diagnosis not present

## 2018-05-31 DIAGNOSIS — Z86010 Personal history of colonic polyps: Secondary | ICD-10-CM | POA: Diagnosis not present

## 2018-06-10 DIAGNOSIS — M542 Cervicalgia: Secondary | ICD-10-CM | POA: Diagnosis not present

## 2018-06-10 DIAGNOSIS — M5030 Other cervical disc degeneration, unspecified cervical region: Secondary | ICD-10-CM | POA: Diagnosis not present

## 2018-06-16 DIAGNOSIS — R972 Elevated prostate specific antigen [PSA]: Secondary | ICD-10-CM | POA: Diagnosis not present

## 2018-06-21 DIAGNOSIS — R351 Nocturia: Secondary | ICD-10-CM | POA: Diagnosis not present

## 2018-06-21 DIAGNOSIS — N401 Enlarged prostate with lower urinary tract symptoms: Secondary | ICD-10-CM | POA: Diagnosis not present

## 2018-06-21 DIAGNOSIS — R972 Elevated prostate specific antigen [PSA]: Secondary | ICD-10-CM | POA: Diagnosis not present

## 2018-07-06 DIAGNOSIS — Z23 Encounter for immunization: Secondary | ICD-10-CM | POA: Diagnosis not present

## 2018-09-19 DIAGNOSIS — L738 Other specified follicular disorders: Secondary | ICD-10-CM | POA: Diagnosis not present

## 2018-09-19 DIAGNOSIS — B351 Tinea unguium: Secondary | ICD-10-CM | POA: Diagnosis not present

## 2018-09-19 DIAGNOSIS — D229 Melanocytic nevi, unspecified: Secondary | ICD-10-CM | POA: Diagnosis not present

## 2018-09-19 DIAGNOSIS — L814 Other melanin hyperpigmentation: Secondary | ICD-10-CM | POA: Diagnosis not present

## 2018-09-19 DIAGNOSIS — Z85828 Personal history of other malignant neoplasm of skin: Secondary | ICD-10-CM | POA: Diagnosis not present

## 2018-09-19 DIAGNOSIS — L218 Other seborrheic dermatitis: Secondary | ICD-10-CM | POA: Diagnosis not present

## 2018-09-19 DIAGNOSIS — L821 Other seborrheic keratosis: Secondary | ICD-10-CM | POA: Diagnosis not present

## 2018-12-13 DIAGNOSIS — M5030 Other cervical disc degeneration, unspecified cervical region: Secondary | ICD-10-CM | POA: Diagnosis not present

## 2018-12-13 DIAGNOSIS — M5033 Other cervical disc degeneration, cervicothoracic region: Secondary | ICD-10-CM | POA: Diagnosis not present

## 2018-12-15 ENCOUNTER — Other Ambulatory Visit: Payer: Self-pay

## 2018-12-15 ENCOUNTER — Emergency Department (HOSPITAL_COMMUNITY): Payer: Medicare Other

## 2018-12-15 ENCOUNTER — Encounter (HOSPITAL_COMMUNITY): Payer: Self-pay | Admitting: Emergency Medicine

## 2018-12-15 ENCOUNTER — Emergency Department (HOSPITAL_COMMUNITY)
Admission: EM | Admit: 2018-12-15 | Discharge: 2018-12-16 | Disposition: A | Discharge status: Home / Self Care | Payer: Medicare Other | Attending: Emergency Medicine | Admitting: Emergency Medicine

## 2018-12-15 DIAGNOSIS — Z79899 Other long term (current) drug therapy: Secondary | ICD-10-CM | POA: Diagnosis not present

## 2018-12-15 DIAGNOSIS — R0789 Other chest pain: Secondary | ICD-10-CM

## 2018-12-15 DIAGNOSIS — I4510 Unspecified right bundle-branch block: Secondary | ICD-10-CM | POA: Diagnosis not present

## 2018-12-15 DIAGNOSIS — Z85828 Personal history of other malignant neoplasm of skin: Secondary | ICD-10-CM | POA: Insufficient documentation

## 2018-12-15 DIAGNOSIS — R079 Chest pain, unspecified: Secondary | ICD-10-CM | POA: Diagnosis not present

## 2018-12-15 LAB — BASIC METABOLIC PANEL
Anion gap: 9 (ref 5–15)
BUN: 19 mg/dL (ref 8–23)
CALCIUM: 9.1 mg/dL (ref 8.9–10.3)
CO2: 25 mmol/L (ref 22–32)
CREATININE: 0.88 mg/dL (ref 0.61–1.24)
Chloride: 103 mmol/L (ref 98–111)
Glucose, Bld: 98 mg/dL (ref 70–99)
Potassium: 4 mmol/L (ref 3.5–5.1)
SODIUM: 137 mmol/L (ref 135–145)

## 2018-12-15 LAB — CBC
HCT: 41.8 % (ref 39.0–52.0)
Hemoglobin: 13.7 g/dL (ref 13.0–17.0)
MCH: 30.8 pg (ref 26.0–34.0)
MCHC: 32.8 g/dL (ref 30.0–36.0)
MCV: 93.9 fL (ref 80.0–100.0)
NRBC: 0 % (ref 0.0–0.2)
PLATELETS: 212 10*3/uL (ref 150–400)
RBC: 4.45 MIL/uL (ref 4.22–5.81)
RDW: 12.9 % (ref 11.5–15.5)
WBC: 14 10*3/uL — AB (ref 4.0–10.5)

## 2018-12-15 LAB — POCT I-STAT TROPONIN I
Troponin i, poc: 0 ng/mL (ref 0.00–0.08)
Troponin i, poc: 0.01 ng/mL (ref 0.00–0.08)

## 2018-12-15 MED ORDER — NITROGLYCERIN 0.4 MG SL SUBL
0.4000 mg | SUBLINGUAL_TABLET | SUBLINGUAL | Status: DC | PRN
  Administered 2018-12-15: 0.4 mg via SUBLINGUAL
  Filled 2018-12-15: qty 1

## 2018-12-15 NOTE — ED Triage Notes (Signed)
Pt c/o central to left chest pains that started couple hours ago. Pt took 2 Diona Fanti believes they were 325mg  and 1 Tum which helped a little.

## 2018-12-15 NOTE — ED Provider Notes (Signed)
[facility] DEPT Provider Note   CSN: [id number] Arrival date & time: 12/15/18  1836     History   Chief Complaint Chief Complaint  Patient presents with  . Chest Pain    HPI [name] is a 73 y.o. male.  HPI  73 year old male presents with chest pain.  Started around 5 PM tonight.  Patient states that he was sitting down talking to a client.  The meeting was a little stressful and they noticed this chest pain.  It feels like a pinching in the middle of his chest.  Does not radiate and there is no dizziness, nausea, vomiting, or shortness of breath.  He has chronic thoracic back pain.  He has had a little bit of low back pain on and off today that is mild and does not seem related to the chest pain.  It is not currently present.  Pain is about a 3 or 4 out of 10.  He took 2 full dose aspirins prior to arrival and a Tums with partial relief.  Past Medical History:  Diagnosis Date  . Actinic keratosis 11/30/2007   Qualifier: Diagnosis of  By: Diona Browner MD, Amy    . Allergic rhinitis   . ALLERGIC RHINITIS 11/30/2007   Qualifier: Diagnosis of  By: Diona Browner MD, Amy    . Arthritis   . Burning chest pain 12/02/2011  . Chest pain 11/26/2011  . Esophageal stricture    EGD 11/07.  stricture dilated.  hiatal hernia, gastritis  . FATIGUE, CHRONIC 09/10/2008   Qualifier: Diagnosis of  By: Diona Browner MD, Amy    . GERD 11/30/2007   Qualifier: Diagnosis of  By: Diona Browner MD, Amy    . GERD (gastroesophageal reflux disease)   . High cholesterol 11/27/2011  . Hyperlipidemia 06/13/2013  . Murmur, cardiac 11/04/2012  . NEOPLASM, SKIN, UNCERTAIN BEHAVIOR 07/19/8100   Qualifier: Diagnosis of  By: Diona Browner MD, Amy    . OBESITY 11/30/2007   Qualifier: Diagnosis of  By: Diona Browner MD, Amy    . Prediabetes 11/27/2011  . ROTATOR CUFF SYNDROME 11/30/2007   Qualifier: Diagnosis of  By: Diona Browner MD, Amy    . SKIN LESION, BENIGN 02/15/2008   Qualifier: Diagnosis of  By: Diona Browner MD, Amy      . SYNCOPE, HX OF 11/30/2007   Qualifier: Diagnosis of  By: Diona Browner MD, Amy    . Tick bite 04/14/2016    Patient Active Problem List   Diagnosis Date Noted  . Hx of adenomatous colonic polyps 03/24/2018  . Tick bite 04/14/2016  . Hyperlipidemia 06/13/2013  . Murmur, cardiac 11/04/2012  . Burning chest pain 12/02/2011  . High cholesterol 11/27/2011  . Prediabetes 11/27/2011  . Chest pain 11/26/2011  . FATIGUE, CHRONIC 09/10/2008  . NEOPLASM, SKIN, UNCERTAIN BEHAVIOR 75/08/2584  . SKIN LESION, BENIGN 02/15/2008  . OBESITY 11/30/2007  . ALLERGIC RHINITIS 11/30/2007  . GERD 11/30/2007  . ACTINIC KERATOSIS 11/30/2007  . ROTATOR CUFF SYNDROME 11/30/2007  . SYNCOPE, HX OF 11/30/2007    Past Surgical History:  Procedure Laterality Date  . SKIN CANCER EXCISION          Home Medications    Prior to Admission medications   Medication Sig Start Date End Date Taking? Authorizing Provider  omeprazole (PRILOSEC) 10 MG capsule Take 10 mg by mouth daily.    [provider]  traMADol (ULTRAM) 50 MG tablet Take 50 mg by mouth at bedtime. 03/10/15   [provider]  Family History Family History  Problem Relation Age of Onset  . Hypertension Mother   . Stroke Father   . Colon cancer Neg Hx     Social History Social History   Tobacco Use  . Smoking status: Never Smoker  . Smokeless tobacco: Never Used  Substance Use Topics  . Alcohol use: No  . Drug use: No     Allergies   Patient has no known allergies.   Review of Systems Review of Systems  Constitutional: Positive for diaphoresis.  Respiratory: Negative for shortness of breath.   Cardiovascular: Positive for chest pain. Negative for leg swelling.  Gastrointestinal: Negative for abdominal pain, nausea and vomiting.  All other systems reviewed and are negative.    Physical Exam Updated Vital Signs BP (!) 161/83 (BP Location: Left Arm)   Pulse 61   Temp 98.3 F (36.8 C) (Oral)   Resp 18    Ht 5\' 6"  (1.676 m)   Wt 86.2 kg   SpO2 95%   BMI 30.67 kg/m   Physical Exam Vitals signs and nursing note reviewed.  Constitutional:      General: He is not in acute distress.    Appearance: He is well-developed. He is not ill-appearing.  HENT:     Head: Normocephalic and atraumatic.     Right Ear: External ear normal.     Left Ear: External ear normal.     Nose: Nose normal.  Eyes:     General:        Right eye: No discharge.        Left eye: No discharge.  Neck:     Musculoskeletal: Neck supple.  Cardiovascular:     Rate and Rhythm: Normal rate and regular rhythm.     Pulses:          Radial pulses are 2+ on the right side and 2+ on the left side.       Posterior tibial pulses are 2+ on the right side and 2+ on the left side.     Heart sounds: Normal heart sounds.  Pulmonary:     Effort: Pulmonary effort is normal.     Breath sounds: Normal breath sounds.  Chest:     Chest wall: No tenderness.  Abdominal:     Palpations: Abdomen is soft.     Tenderness: There is no abdominal tenderness.  Musculoskeletal:     Right lower leg: No edema.     Left lower leg: No edema.  Skin:    General: Skin is warm and dry.  Neurological:     Mental Status: He is alert.  Psychiatric:        Mood and Affect: Mood is not anxious.      ED Treatments / Results  Labs (all labs ordered are listed, but only abnormal results are displayed) Labs Reviewed  CBC - Abnormal; Notable for the following components:      Result Value   WBC 14.0 (*)    All other components within normal limits  BASIC METABOLIC PANEL  I-STAT TROPONIN, ED  POCT I-STAT TROPONIN I  I-STAT TROPONIN, ED  POCT I-STAT TROPONIN I    EKG EKG Interpretation  Date/Time:  Thursday December 15 2018 18:42:59 EST Ventricular Rate:  63 PR Interval:    QRS Duration: 121 QT Interval:  420 QTC Calculation: 430 R Axis:   42 Text Interpretation:  Sinus rhythm IVCD, consider atypical RBBB Baseline wander in lead(s)  V2 No old tracing to compare  Confirmed by Sherwood Gambler [phone]) on 12/15/2018 7:00:41 PM   EKG Interpretation  Date/Time:  Thursday December 15 2018 23:43:03 EST Ventricular Rate:  67 PR Interval:    QRS Duration: 127 QT Interval:  427 QTC Calculation: 451 R Axis:   37 Text Interpretation:  Sinus rhythm Borderline prolonged PR interval IVCD, consider atypical RBBB no significant change since earlier in the day Confirmed by Sherwood Gambler [phone]) on 12/15/2018 11:47:59 PM       Radiology Dg Chest 2 View  Result Date: 12/15/2018 CLINICAL DATA:  Central left chest pain starting a couple of hours ago. Nonsmoker. EXAM: CHEST - 2 VIEW COMPARISON:  05/21/2007 FINDINGS: Shallow inspiration. Normal heart size and pulmonary vascularity. No focal airspace disease or consolidation in the lungs. No blunting of costophrenic angles. No pneumothorax. Mediastinal contours appear intact. Azygos lobe. Degenerative changes in the spine. IMPRESSION: No active cardiopulmonary disease. Electronically Signed   By: Lucienne Capers M.D.   On: 12/15/2018 19:08    Procedures Procedures (including critical care time)  Medications Ordered in ED Medications  nitroGLYCERIN (NITROSTAT) SL tablet 0.4 mg (has no administration in time range)     Initial Impression / Assessment and Plan / ED Course  I have reviewed the triage vital signs and the nursing notes.  Pertinent labs & imaging results that were available during my care of the patient were reviewed by me and considered in my medical decision making (see chart for details).     Chest pain is pretty atypical.  However with his age, I did offer admission for ACS rule out.  He understands that the negative troponins do not mean there is no cardiac disease at all.  He wants to go home and has a cardiologist he can follow-up with.  I doubt PE or dissection, especially given his well appearance.  He was given nitroglycerin once but it did not seem to change his  symptoms.  He has had this pain on and off for a while after further talking with him and his wife.  It was advised that he follow-up closely with PCP/cardiology, but also that he may and is encouraged to return at any time, especially of his symptoms recur or worsen.  Final Clinical Impressions(s) / ED Diagnoses   Final diagnoses:  Atypical chest pain    ED Discharge Orders    None       Sherwood Gambler, MD 12/16/18 [id number]

## 2018-12-16 NOTE — Discharge Instructions (Addendum)
If you develop recurrent, continued, or worsening chest pain, shortness of breath, fever, vomiting, abdominal or back pain, or any other new/concerning symptoms then return to the ER for evaluation.  

## 2019-01-05 NOTE — Progress Notes (Signed)
Cardiology Office Note Date:  01/09/2019  Patient ID:  [name], [name] October 17, 1946, MRN [id number] PCP:  Shawnee Knapp, MD  Cardiologist:  Dr. Rockey Situ, MD    Chief Complaint: ED follow up  History of Present Illness: [name] is a 73 y.o. male with history of chest pain, esophageal stricture, GERD, HTN, HLD, and obesity who presents for ED follow up.   Remote stress test from 2007 was normal. Echo from 2014 showed EF of 55-60%, no RWMA, Gr1DD, mild AI/MR, RVSF normal, PASP normal.   Patient was previously evaluated by Dr. Rockey Situ in 2018 for chest burning after using dumbbells when laying in a supine position with noted prior improvement with steroids for presumed MSK etiology. Patient underwent calcium score in 06/2017 which showed a normal sized aorta with minimal calcifications in the aortic root and a calcium score of 0. Patient was seen in the ED on 12/15/2018 for chest pain that began when he was sitting down talking to a client in a stressful meeting that was described as a pinching in the middle of his chest and did not radiate. There were no associated symptoms. Cardiac enzymes were negative. EKG was not acute. Mild leukocytosis of 14 was noted. CXR was not acute. Outpatient follow up was advised.   Patient comes in today doing well from a cardiac perspective.  He reports he was in his usual state of health until 12/15/2018 when he had a rather intense conversation with a client in which he had to tell them to "shut up."  In this setting, the patient noted anterior chest wall pain that was located along the upper left and right chest as well as midline chest.  Pain was sharp and reproducible to palpation.  Pain lasted for 35 to 60 minutes with spontaneous resolution.  Since then, he has continued to note intermittent chest pain occurring at rest that is typically located along the anterior left upper chest wall and is reproducible to palpation.  He reports a prior history of  costochondritis which improved with ibuprofen.  He has been taking ibuprofen intermittently since his ED visit with improvement and symptoms.  He also notes left posterior shoulder pain which has been longstanding and recently underwent a steroid injection with improvement in shoulder pain.  His more recent chest pain that does not feel similar to his prior reflux.  He is currently chest pain-free.  He denies any associated shortness of breath, nausea, vomiting, palpitations, dizziness, presyncope, or syncope.  No lower extremity swelling, abdominal distention, orthopnea, PND, or early satiety.  No falls since he was last seen.  No BRBPR or melena.  Past Medical History:  Diagnosis Date  . Actinic keratosis 11/30/2007   Qualifier: Diagnosis of  By: Diona Browner MD, Amy    . ALLERGIC RHINITIS 11/30/2007   Qualifier: Diagnosis of  By: Diona Browner MD, Amy    . Arthritis   . Burning chest pain 12/02/2011  . Chest pain 11/26/2011  . Esophageal stricture    EGD 11/07.  stricture dilated.  hiatal hernia, gastritis  . FATIGUE, CHRONIC 09/10/2008   Qualifier: Diagnosis of  By: Diona Browner MD, Amy    . GERD 11/30/2007   Qualifier: Diagnosis of  By: Diona Browner MD, Amy    . Hyperlipidemia 06/13/2013  . Murmur, cardiac 11/04/2012  . NEOPLASM, SKIN, UNCERTAIN BEHAVIOR 06/24/2118   Qualifier: Diagnosis of  By: Diona Browner MD, Amy    . OBESITY 11/30/2007   Qualifier: Diagnosis of  By: Diona Browner MD,  Amy    . Prediabetes 11/27/2011  . ROTATOR CUFF SYNDROME 11/30/2007   Qualifier: Diagnosis of  By: Diona Browner MD, Amy    . SKIN LESION, BENIGN 02/15/2008   Qualifier: Diagnosis of  By: Diona Browner MD, Amy    . SYNCOPE, HX OF 11/30/2007   Qualifier: Diagnosis of  By: Diona Browner MD, Amy    . Tick bite 04/14/2016    Past Surgical History:  Procedure Laterality Date  . SKIN CANCER EXCISION      Current Meds  Medication Sig  . finasteride (PROSCAR) 5 MG tablet Take 5 mg by mouth daily.   Marland Kitchen gabapentin (NEURONTIN) 300 MG capsule Take 300 mg by mouth  at bedtime.   . magnesium 30 MG tablet Take 30 mg by mouth daily.  . Misc Natural Products (PROSTATE) CAPS Take 1 capsule by mouth daily.  . Multiple Vitamins-Minerals (MULTIVITAMIN ADULT) TABS Take 1 tablet by mouth daily.  Marland Kitchen omeprazole (PRILOSEC) 10 MG capsule Take 10 mg by mouth daily.  . vitamin B-12 (CYANOCOBALAMIN) 1000 MCG tablet Take 1,000 mcg by mouth daily.    Allergies:   Patient has no known allergies.   Social History:  The patient  reports that he has never smoked. He has never used smokeless tobacco. He reports that he does not drink alcohol or use drugs.   Family History:  The patient's family history includes Hypertension in his mother; Stroke in his father.  ROS:   Review of Systems  Constitutional: Positive for malaise/fatigue. Negative for chills, diaphoresis, fever and weight loss.  HENT: Negative for congestion.   Eyes: Negative for discharge and redness.  Respiratory: Negative for cough, hemoptysis, sputum production, shortness of breath and wheezing.   Cardiovascular: Positive for chest pain. Negative for palpitations, orthopnea, claudication, leg swelling and PND.  Gastrointestinal: Negative for abdominal pain, blood in stool, heartburn, melena, nausea and vomiting.  Genitourinary: Negative for hematuria.  Musculoskeletal: Positive for joint pain. Negative for falls and myalgias.       Left posterior shoulder pain which is improved following recent steroid injection.  Skin: Negative for rash.  Neurological: Positive for weakness. Negative for dizziness, tingling, tremors, sensory change, speech change, focal weakness and loss of consciousness.  Endo/Heme/Allergies: Does not bruise/bleed easily.  Psychiatric/Behavioral: Negative for substance abuse. The patient is not nervous/anxious.   All other systems reviewed and are negative.    PHYSICAL EXAM:  VS:  BP (!) 152/82 (BP Location: Left Arm, Patient Position: Sitting, Cuff Size: Normal)   Pulse 80   Ht 5\' 6"   (1.676 m)   Wt 194 lb 12 oz (88.3 kg)   BMI 31.43 kg/m  BMI: Body mass index is 31.43 kg/m.  Physical Exam  Constitutional: He is oriented to person, place, and time. He appears well-developed and well-nourished.  HENT:  Head: Normocephalic and atraumatic.  Eyes: Right eye exhibits no discharge. Left eye exhibits no discharge.  Neck: Normal range of motion. No JVD present.  Cardiovascular: Normal rate, regular rhythm, S1 normal and S2 normal. Exam reveals no distant heart sounds, no friction rub, no midsystolic click and no opening snap.  Murmur heard. Pulses:      Posterior tibial pulses are 2+ on the right side and 2+ on the left side.  Patient's anterior left upper chest wall is significantly tender to palpation which reproduces the patient's experienced symptoms.  1/6 systolic murmur best heard in the upper sternal border.  Pulmonary/Chest: Effort normal and breath sounds normal. No respiratory distress. He has  no decreased breath sounds. He has no wheezes. He has no rales. He exhibits no tenderness.  Abdominal: Soft. He exhibits no distension. There is no abdominal tenderness.  Musculoskeletal:        General: No edema.  Neurological: He is alert and oriented to person, place, and time.  Skin: Skin is warm and dry. No cyanosis. Nails show no clubbing.  Psychiatric: He has a normal mood and affect. His speech is normal and behavior is normal. Judgment and thought content normal.     EKG:  Was ordered and interpreted by me today. Shows NSR, 80 bpm, no acute ST-T changes  Recent Labs: 12/15/2018: BUN 19; Creatinine, Ser 0.88; Hemoglobin 13.7; Platelets 212; Potassium 4.0; Sodium 137  No results found for requested labs within last 8760 hours.   CrCl cannot be calculated (Patient's most recent lab result is older than the maximum 21 days allowed.).   Wt Readings from Last 3 Encounters:  01/09/19 194 lb 12 oz (88.3 kg)  12/15/18 190 lb (86.2 kg)  03/24/18 187 lb (84.8 kg)      Other studies reviewed: Additional studies/records reviewed today include: summarized above  ASSESSMENT AND PLAN:  1. Chest pain with moderate risk for cardiac etiology: Currently chest pain-free.  Pain is fairly atypical in that it has never occurred with exertion and is reproducible to palpation.  Discussed ischemic evaluation options in detail.  Ultimately, the patient decided to proceed with cardiac CT with FFR.  If this is found to be unrevealing, would recommend continue treatment for musculoskeletal etiology.  Should also consider GI given his history of significant reflux as well as prior esophageal stricture.  Aggressive risk factor prevention.  2. Cardiac murmur: Prior echo in 2014 showed mild aortic regurgitation and mitral regurgitation.  Schedule echo.  3. HTN: Blood pressure is suboptimally controlled today at 152/82.  Recent readings have been reasonably controlled in the 120s to 130s over 70s.  Initiation of pharmacotherapy has been deferred at this time however, if blood pressure continues to be suboptimally controlled and follow-up we should revisit this.  4. HLD: No recent lipid panel.  Check lipid panel along with HFP for further risk stratification.  5. Obesity: Weight loss is recommended.  6. Esophageal stricture/GERD: Status post prior EGD with dilatation.  Followed by GI.  Disposition: F/u with Dr. Rockey Situ or an APP in 6 to 8 weeks following cardiac CT.  Current medicines are reviewed at length with the patient today.  The patient did not have any concerns regarding medicines.  Signed, Christell Faith, PA-C 01/09/2019 8:26 AM     [facility] [address] [place] [address] [place], [place] [address] [phone]

## 2019-01-08 ENCOUNTER — Encounter: Payer: Self-pay | Admitting: Physician Assistant

## 2019-01-09 ENCOUNTER — Encounter: Payer: Self-pay | Admitting: Physician Assistant

## 2019-01-09 ENCOUNTER — Ambulatory Visit (INDEPENDENT_AMBULATORY_CARE_PROVIDER_SITE_OTHER): Payer: Medicare Other | Admitting: Physician Assistant

## 2019-01-09 VITALS — BP 152/82 | HR 80 | Ht 66.0 in | Wt 194.8 lb

## 2019-01-09 DIAGNOSIS — R079 Chest pain, unspecified: Secondary | ICD-10-CM

## 2019-01-09 DIAGNOSIS — Z6831 Body mass index (BMI) 31.0-31.9, adult: Secondary | ICD-10-CM | POA: Diagnosis not present

## 2019-01-09 DIAGNOSIS — K219 Gastro-esophageal reflux disease without esophagitis: Secondary | ICD-10-CM

## 2019-01-09 DIAGNOSIS — R011 Cardiac murmur, unspecified: Secondary | ICD-10-CM | POA: Diagnosis not present

## 2019-01-09 DIAGNOSIS — I10 Essential (primary) hypertension: Secondary | ICD-10-CM

## 2019-01-09 DIAGNOSIS — E6609 Other obesity due to excess calories: Secondary | ICD-10-CM

## 2019-01-09 DIAGNOSIS — E782 Mixed hyperlipidemia: Secondary | ICD-10-CM

## 2019-01-09 DIAGNOSIS — K222 Esophageal obstruction: Secondary | ICD-10-CM

## 2019-01-09 DIAGNOSIS — R072 Precordial pain: Secondary | ICD-10-CM

## 2019-01-09 MED ORDER — METOPROLOL TARTRATE 50 MG PO TABS: 50.0000 mg | ORAL_TABLET | Freq: Once | ORAL | 0 refills | Status: DC

## 2019-01-09 NOTE — Patient Instructions (Addendum)
Medication Instructions:  Your physician recommends that you continue on your current medications as directed. Please refer to the Current Medication list given to you today.  If you need a refill on your cardiac medications before your next appointment, please call your pharmacy.   Lab work: Your physician recommends that you return for lab work: 1- Today (LFTS, Lipid) 2- 30 days prior to Cardiac CT  If you have labs (blood work) drawn today and your tests are completely normal, you will receive your results only by: Marland Kitchen MyChart Message (if you have MyChart) OR . A paper copy in the mail If you have any lab test that is abnormal or we need to change your treatment, we will call you to review the results.  Testing/Procedures: 1- Echo Echo  Please return to [facility] on ______________ at _______________ AM/PM for an Echocardiogram. Your physician has requested that you have an echocardiogram. Echocardiography is a painless test that uses sound waves to create images of your heart. It provides your doctor with information about the size and shape of your heart and how well your heart's chambers and valves are working. This procedure takes approximately one hour. There are no restrictions for this procedure. Please note; depending on visual quality an IV may need to be placed.   2- Cardiac CT Please arrive at the [facility] main entrance of [facility] at xx:xx AM (30-45 minutes prior to test start time)  [facility] [place], [place] [address] [phone]  Proceed to the [facility] Radiology Department (First Floor).  Please follow these instructions carefully (unless otherwise directed):  Hold all erectile dysfunction medications at least 48 hours prior to test.  On the Night Before the Test: . Be sure to Drink plenty of water. . Do not consume any caffeinated/decaffeinated beverages or chocolate 12 hours prior to your  test. . Do not take any antihistamines 12 hours prior to your test.  On the Day of the Test: . Drink plenty of water. Do not drink any water within one hour of the test. . Do not eat any food 4 hours prior to the test. . You may take your regular medications prior to the test.  . Take metoprolol (Lopressor) two hours prior to test.                After the Test: . Drink plenty of water. . After receiving IV contrast, you may experience a mild flushed feeling. This is normal. . On occasion, you may experience a mild rash up to 24 hours after the test. This is not dangerous. If this occurs, you can take Benadryl 25 mg and increase your fluid intake. . If you experience trouble breathing, this can be serious. If it is severe call 911 IMMEDIATELY. If it is mild, please call our office. . If you take any of these medications: Glipizide/Metformin, Avandament, Glucavance, please do not take 48 hours after completing test.   Follow-Up: At [facility], you and your health needs are our priority.  As part of our continuing mission to provide you with exceptional heart care, we have created designated Provider Care Teams.  These Care Teams include your primary Cardiologist (physician) and Advanced Practice Providers (APPs -  Physician Assistants and Nurse Practitioners) who all work together to provide you with the care you need, when you need it. You will need a follow up appointment in Following your CT. Please call the office when it  is scheduled so that we can get your follow up.  Please see Dr. Rockey Situ or Christell Faith, PA.

## 2019-01-10 LAB — HEPATIC FUNCTION PANEL
ALT: 29 IU/L (ref 0–44)
AST: 21 IU/L (ref 0–40)
Albumin: 4.4 g/dL (ref 3.7–4.7)
Alkaline Phosphatase: 56 IU/L (ref 39–117)
Bilirubin Total: 0.4 mg/dL (ref 0.0–1.2)
Bilirubin, Direct: 0.09 mg/dL (ref 0.00–0.40)
Total Protein: 7 g/dL (ref 6.0–8.5)

## 2019-01-10 LAB — LIPID PANEL
CHOL/HDL RATIO: 4.4 ratio (ref 0.0–5.0)
Cholesterol, Total: 201 mg/dL — ABNORMAL HIGH (ref 100–199)
HDL: 46 mg/dL (ref >39)
LDL Calculated: 112 mg/dL — ABNORMAL HIGH (ref 0–99)
Triglycerides: 214 mg/dL — ABNORMAL HIGH (ref 0–149)
VLDL Cholesterol Cal: 43 mg/dL — ABNORMAL HIGH (ref 5–40)

## 2019-02-07 ENCOUNTER — Other Ambulatory Visit: Payer: Medicare Other

## 2019-03-16 ENCOUNTER — Other Ambulatory Visit: Payer: Medicare Other

## 2019-03-27 ENCOUNTER — Telehealth: Payer: Self-pay

## 2019-03-27 NOTE — Telephone Encounter (Signed)
L/m for pt re overdue health maintenance items.  Please c/b to either schedule appt or have PCP name updated.

## 2019-03-28 ENCOUNTER — Telehealth: Payer: Self-pay | Admitting: Cardiovascular Disease

## 2019-03-28 ENCOUNTER — Telehealth: Payer: Self-pay | Admitting: Physician Assistant

## 2019-03-28 ENCOUNTER — Telehealth (HOSPITAL_COMMUNITY): Payer: Self-pay | Admitting: Emergency Medicine

## 2019-03-28 NOTE — Telephone Encounter (Signed)
Left message to call and schedule ct.

## 2019-03-28 NOTE — Telephone Encounter (Signed)

## 2019-03-28 NOTE — Telephone Encounter (Addendum)
Reaching out to patient to offer assistance regarding upcoming cardiac imaging study; pt verbalizes understanding of appt date/time, parking situation and where to check in, pre-test NPO status and medications ordered, and verified current allergies; name and call back number provided for further questions should they arise Marchia Bond RN [facility] [phone] office [phone] cell  Pt denies covid 19 symptoms, verbalized understanding of visitor policy.

## 2019-03-28 NOTE — Addendum Note (Signed)
Addended by: Ricci Barker on: 03/28/2019 03:49 PM   Modules accepted: Orders

## 2019-03-28 NOTE — Telephone Encounter (Signed)
Follow up   Patient is returning call. Please call patient on work #.

## 2019-03-29 ENCOUNTER — Ambulatory Visit (HOSPITAL_COMMUNITY)
Admission: RE | Admit: 2019-03-29 | Discharge: 2019-03-29 | Disposition: A | Discharge status: Home / Self Care | Payer: Medicare Other | Source: Ambulatory Visit | Attending: Physician Assistant | Admitting: Physician Assistant

## 2019-03-29 ENCOUNTER — Encounter (HOSPITAL_COMMUNITY): Payer: Self-pay

## 2019-03-29 ENCOUNTER — Other Ambulatory Visit: Payer: Self-pay

## 2019-03-29 DIAGNOSIS — R072 Precordial pain: Secondary | ICD-10-CM | POA: Insufficient documentation

## 2019-03-29 LAB — POCT I-STAT CREATININE: Creatinine, Ser: 0.8 mg/dL (ref 0.61–1.24)

## 2019-03-29 MED ORDER — NITROGLYCERIN 0.4 MG SL SUBL
0.8000 mg | SUBLINGUAL_TABLET | Freq: Once | SUBLINGUAL | Status: AC
  Administered 2019-03-29: 0.8 mg via SUBLINGUAL

## 2019-03-29 MED ORDER — IOHEXOL 350 MG/ML SOLN
90.0000 mL | Freq: Once | INTRAVENOUS | Status: AC | PRN
  Administered 2019-03-29: 10:00:00 90 mL via INTRAVENOUS

## 2019-03-29 MED ORDER — NITROGLYCERIN 0.4 MG SL SUBL
SUBLINGUAL_TABLET | SUBLINGUAL | Status: AC
  Filled 2019-03-29: qty 2

## 2019-03-31 ENCOUNTER — Telehealth: Payer: Self-pay

## 2019-03-31 DIAGNOSIS — IMO0001 Reserved for inherently not codable concepts without codable children: Secondary | ICD-10-CM

## 2019-03-31 DIAGNOSIS — R911 Solitary pulmonary nodule: Secondary | ICD-10-CM

## 2019-03-31 NOTE — Telephone Encounter (Signed)
-----   Message from Rise Mu, PA-C sent at 03/30/2019  9:51 PM EDT ----- Cardiac findings showed no obstructive CAD. Aggressive risk factor modification is recommended. As noted in the extracardiac findings, he will need follow up noncontrast CT in 12 months for pulmonary nodule.

## 2019-03-31 NOTE — Telephone Encounter (Signed)
Patient made aware of his Cor CTA results with verbalized understanding. Order placed for 90mo f/u chest CT wo/ contrast. Adv the pt that he will contacted closer to that time to have the CT scheduled. Pt is sch for a echo in June 2020 pt is adv to keep that appt. Pt verbalized understanding and voiced appreciation for the call.

## 2019-05-03 DIAGNOSIS — Z20828 Contact with and (suspected) exposure to other viral communicable diseases: Secondary | ICD-10-CM | POA: Diagnosis not present

## 2019-05-10 ENCOUNTER — Other Ambulatory Visit: Payer: Medicare Other

## 2019-05-12 ENCOUNTER — Other Ambulatory Visit: Payer: Medicare Other

## 2019-05-12 DIAGNOSIS — Z1331 Encounter for screening for depression: Secondary | ICD-10-CM | POA: Diagnosis not present

## 2019-05-12 DIAGNOSIS — R030 Elevated blood-pressure reading, without diagnosis of hypertension: Secondary | ICD-10-CM | POA: Diagnosis not present

## 2019-05-12 DIAGNOSIS — Z9181 History of falling: Secondary | ICD-10-CM | POA: Diagnosis not present

## 2019-05-12 DIAGNOSIS — M940 Chondrocostal junction syndrome [Tietze]: Secondary | ICD-10-CM | POA: Diagnosis not present

## 2019-06-29 DIAGNOSIS — R972 Elevated prostate specific antigen [PSA]: Secondary | ICD-10-CM | POA: Diagnosis not present

## 2019-07-04 DIAGNOSIS — Z23 Encounter for immunization: Secondary | ICD-10-CM | POA: Diagnosis not present

## 2019-07-06 DIAGNOSIS — R972 Elevated prostate specific antigen [PSA]: Secondary | ICD-10-CM | POA: Diagnosis not present

## 2019-07-06 DIAGNOSIS — N401 Enlarged prostate with lower urinary tract symptoms: Secondary | ICD-10-CM | POA: Diagnosis not present

## 2019-07-06 DIAGNOSIS — R351 Nocturia: Secondary | ICD-10-CM | POA: Diagnosis not present

## 2019-07-20 DIAGNOSIS — M5033 Other cervical disc degeneration, cervicothoracic region: Secondary | ICD-10-CM | POA: Diagnosis not present

## 2019-07-22 IMAGING — CR DG CHEST 2V
2 series · 2 of 2 positions shown · non-contrast
Comparison: 05/21/2007

CLINICAL DATA: Central left chest pain starting a couple of hours
ago. Nonsmoker.

EXAM:
CHEST - 2 VIEW

[w chest pa]
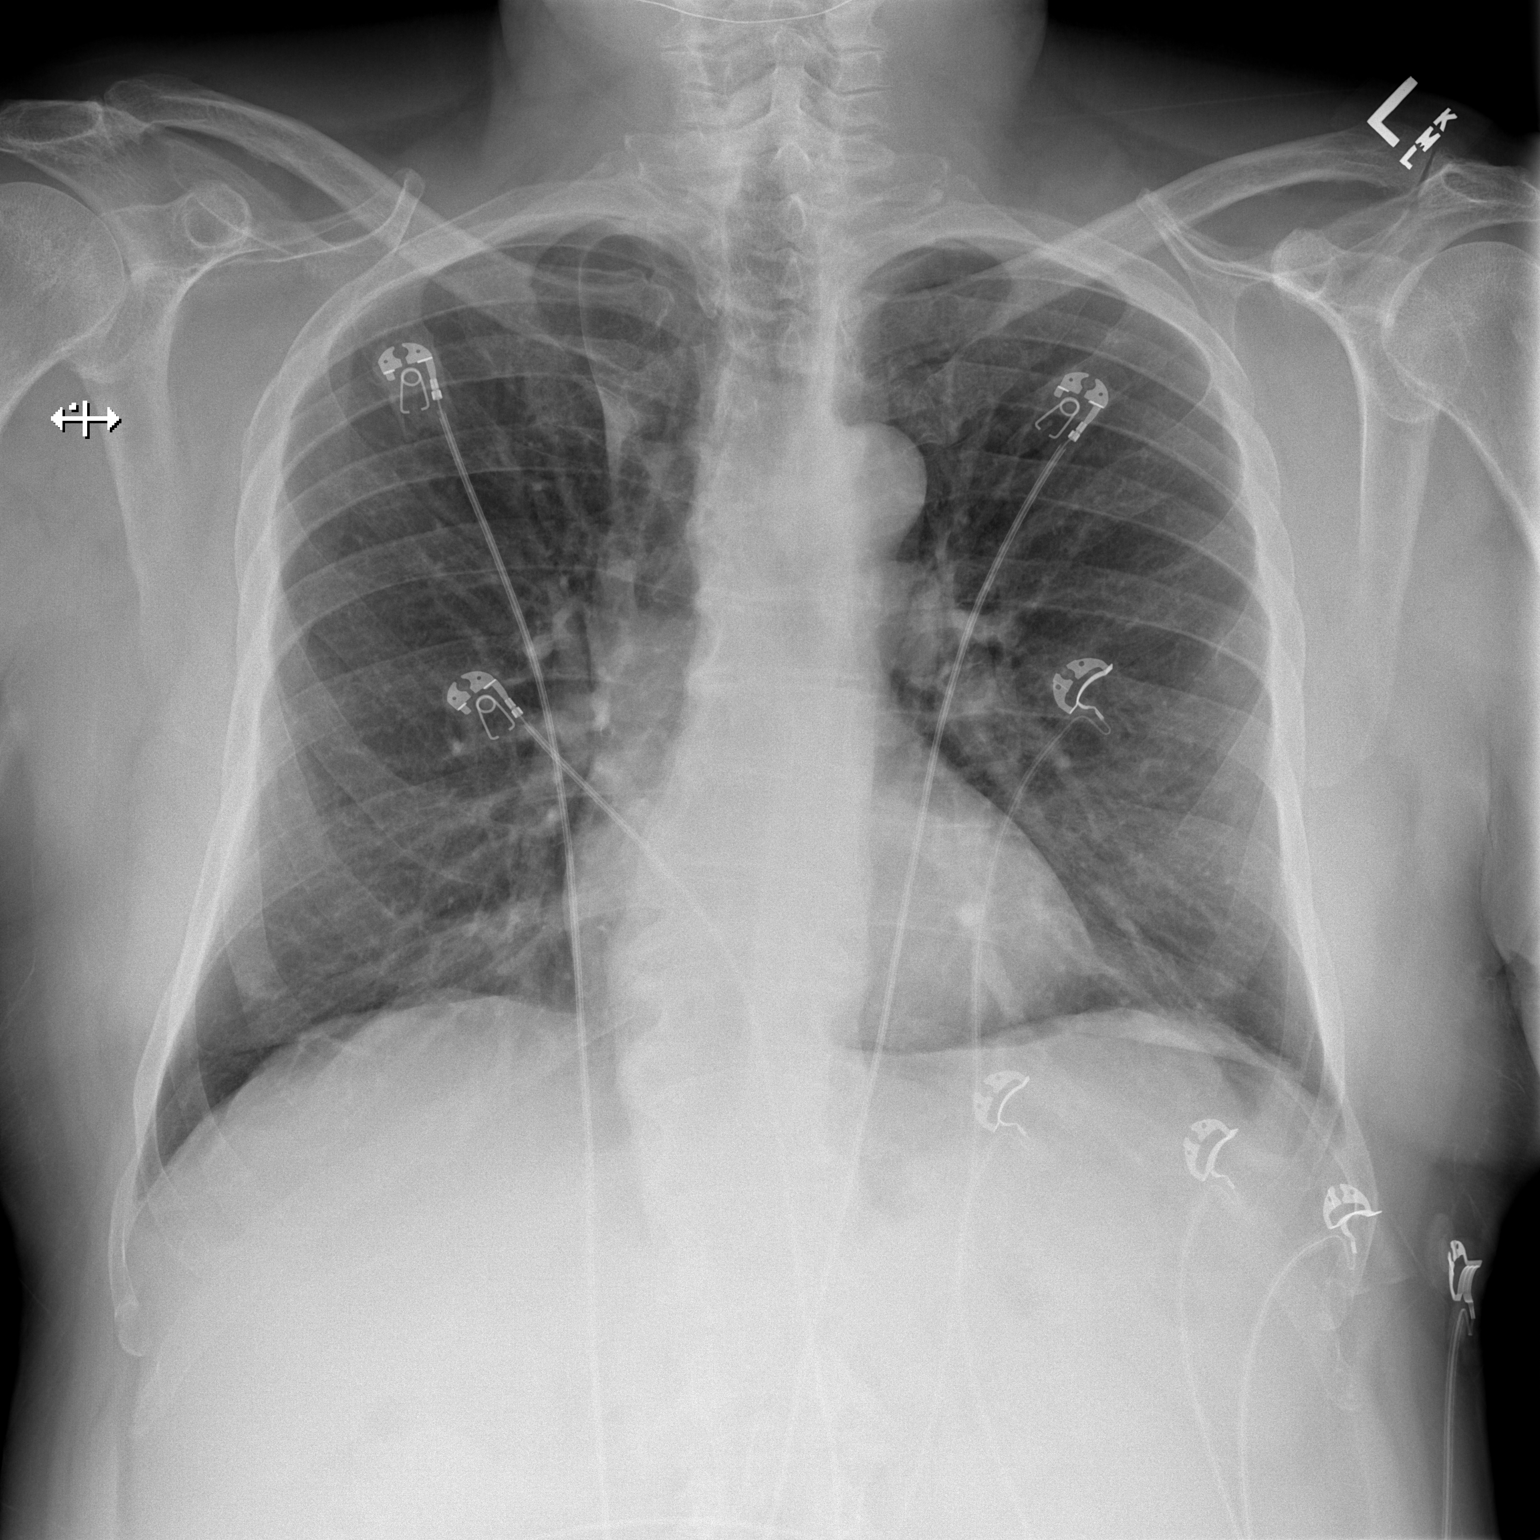

[w chest lat]
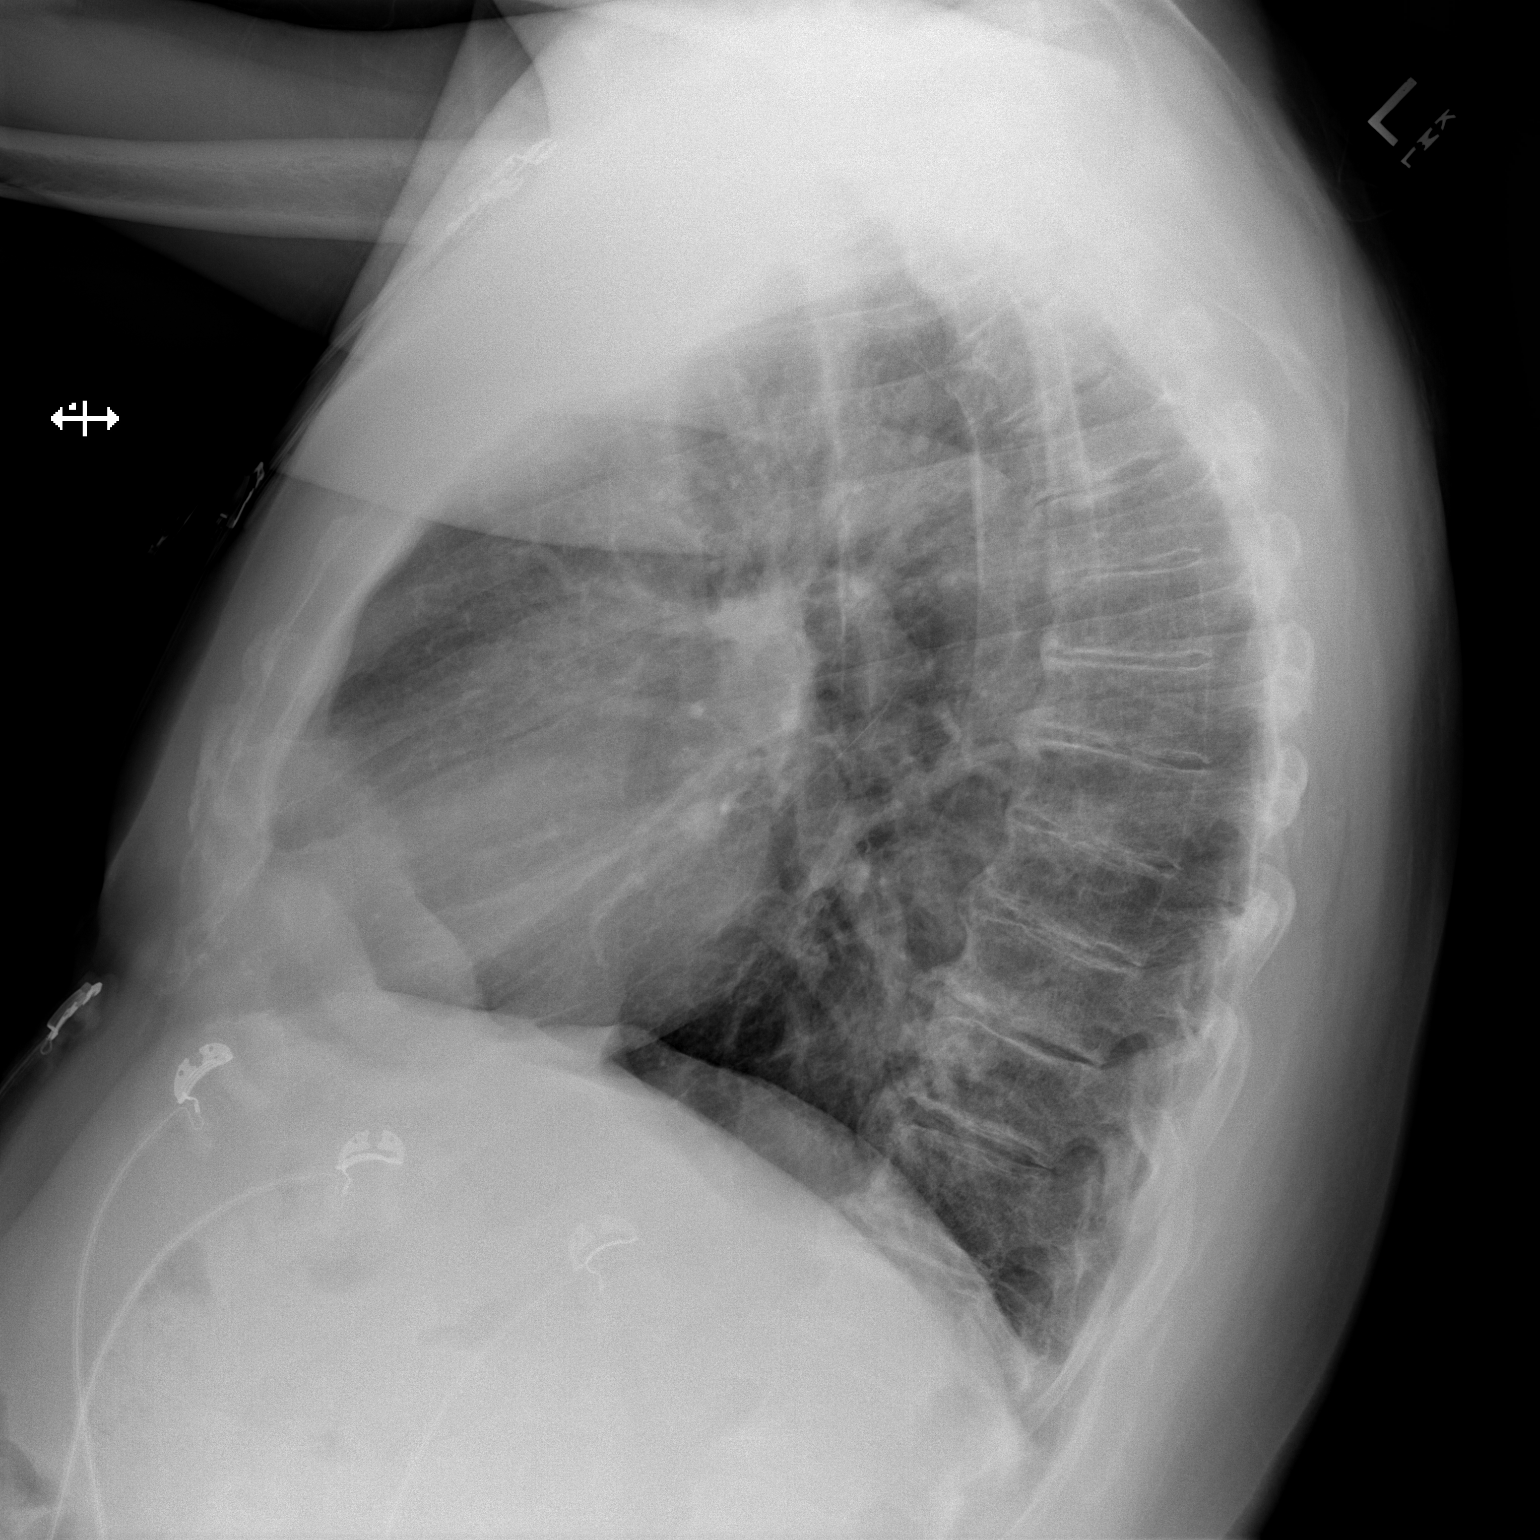

[2 of 2 positions shown; findings below may reference images not displayed]

FINDINGS: Shallow inspiration. Normal heart size and pulmonary vascularity. No
focal airspace disease or consolidation in the lungs. No blunting of
costophrenic angles. No pneumothorax. Mediastinal contours appear
intact. Azygos lobe. Degenerative changes in the spine.
IMPRESSION: No active cardiopulmonary disease.

## 2019-08-31 DIAGNOSIS — H5213 Myopia, bilateral: Secondary | ICD-10-CM | POA: Diagnosis not present

## 2019-08-31 DIAGNOSIS — H2513 Age-related nuclear cataract, bilateral: Secondary | ICD-10-CM | POA: Diagnosis not present

## 2019-09-21 DIAGNOSIS — L814 Other melanin hyperpigmentation: Secondary | ICD-10-CM | POA: Diagnosis not present

## 2019-09-21 DIAGNOSIS — L570 Actinic keratosis: Secondary | ICD-10-CM | POA: Diagnosis not present

## 2019-09-21 DIAGNOSIS — D229 Melanocytic nevi, unspecified: Secondary | ICD-10-CM | POA: Diagnosis not present

## 2019-09-21 DIAGNOSIS — L821 Other seborrheic keratosis: Secondary | ICD-10-CM | POA: Diagnosis not present

## 2019-09-21 DIAGNOSIS — D485 Neoplasm of uncertain behavior of skin: Secondary | ICD-10-CM | POA: Diagnosis not present

## 2019-09-21 DIAGNOSIS — D1801 Hemangioma of skin and subcutaneous tissue: Secondary | ICD-10-CM | POA: Diagnosis not present

## 2019-09-26 ENCOUNTER — Encounter (INDEPENDENT_AMBULATORY_CARE_PROVIDER_SITE_OTHER): Payer: Self-pay | Admitting: Otolaryngology

## 2019-09-26 ENCOUNTER — Ambulatory Visit (INDEPENDENT_AMBULATORY_CARE_PROVIDER_SITE_OTHER): Payer: Medicare Other | Admitting: Otolaryngology

## 2019-09-26 ENCOUNTER — Other Ambulatory Visit: Payer: Self-pay

## 2019-09-26 VITALS — Temp 97.6°F

## 2019-09-26 DIAGNOSIS — H6122 Impacted cerumen, left ear: Secondary | ICD-10-CM | POA: Diagnosis not present

## 2019-09-26 NOTE — Progress Notes (Signed)
HPI: [name] is a 73 y.o. male who is referred by [facility] for evaluation of cerumen impaction. Patient woke up Sunday being unable to hear out of his left ear which is his better hearing ear. He states he used a q-tip which made hearing worse. He wears bilateral hearing aids so he reported to [facility] who told him they were unable to perform test due to cerumen impaction in the left ear. He has no right-sided symptoms. He denies ear pain or ear drainage.  Past Medical History:  Diagnosis Date  . Actinic keratosis 11/30/2007   Qualifier: Diagnosis of  By: Diona Browner MD, Amy    . ALLERGIC RHINITIS 11/30/2007   Qualifier: Diagnosis of  By: Diona Browner MD, Amy    . Arthritis   . Burning chest pain 12/02/2011  . Chest pain 11/26/2011  . Esophageal stricture    EGD 11/07.  stricture dilated.  hiatal hernia, gastritis  . FATIGUE, CHRONIC 09/10/2008   Qualifier: Diagnosis of  By: Diona Browner MD, Amy    . GERD 11/30/2007   Qualifier: Diagnosis of  By: Diona Browner MD, Amy    . Hyperlipidemia 06/13/2013  . Murmur, cardiac 11/04/2012  . NEOPLASM, SKIN, UNCERTAIN BEHAVIOR [id number]   Qualifier: Diagnosis of  By: Diona Browner MD, Amy    . OBESITY 11/30/2007   Qualifier: Diagnosis of  By: Diona Browner MD, Amy    . Prediabetes 11/27/2011  . ROTATOR CUFF SYNDROME 11/30/2007   Qualifier: Diagnosis of  By: Diona Browner MD, Amy    . SKIN LESION, BENIGN 02/15/2008   Qualifier: Diagnosis of  By: Diona Browner MD, Amy    . SYNCOPE, HX OF 11/30/2007   Qualifier: Diagnosis of  By: Diona Browner MD, Amy    . Tick bite 04/14/2016   Past Surgical History:  Procedure Laterality Date  . SKIN CANCER EXCISION     Social History   Socioeconomic History  . Marital status: Married    Spouse name: Not on file  . Number of children: 2  . Years of education: Not on file  . Highest education level: Not on file  Occupational History  . Occupation: Occupational hygienist  . Financial resource strain: Not on file  . Food insecurity   Worry: Not on file    Inability: Not on file  . Transportation needs    Medical: Not on file    Non-medical: Not on file  Tobacco Use  . Smoking status: Never Smoker  . Smokeless tobacco: Never Used  Substance and Sexual Activity  . Alcohol use: No  . Drug use: No  . Sexual activity: Not on file  Lifestyle  . Physical activity    Days per week: Not on file    Minutes per session: Not on file  . Stress: Not on file  Relationships  . Social Herbalist on phone: Not on file    Gets together: Not on file    Attends religious service: Not on file    Active member of club or organization: Not on file    Attends meetings of clubs or organizations: Not on file    Relationship status: Not on file  Other Topics Concern  . Not on file  Social History Narrative   Regular exercise- walks 3-4 times per week, lifts weights, bicycling 10 miles every other day.   Diet- 3 meals , lots of fluids, stopped Coke, changed to green tea.   Family History  Problem Relation Age of Onset  .  Hypertension Mother   . Stroke Father   . Colon cancer Neg Hx    No Known Allergies Prior to Admission medications   Medication Sig Start Date End Date Taking? Authorizing Provider  finasteride (PROSCAR) 5 MG tablet Take 5 mg by mouth daily.  12/03/18  Yes [provider]  gabapentin (NEURONTIN) 300 MG capsule Take 300 mg by mouth at bedtime.  12/06/18  Yes [provider]  magnesium 30 MG tablet Take 30 mg by mouth daily.   Yes [provider]  Misc Natural Products (PROSTATE) CAPS Take 1 capsule by mouth daily.   Yes [provider]  Multiple Vitamins-Minerals (MULTIVITAMIN ADULT) TABS Take 1 tablet by mouth daily.   Yes [provider]  omeprazole (PRILOSEC) 10 MG capsule Take 10 mg by mouth daily.   Yes [provider]  vitamin B-12 (CYANOCOBALAMIN) 1000 MCG tablet Take 1,000 mcg by mouth daily.   Yes [provider]  metoprolol tartrate  (LOPRESSOR) 50 MG tablet Take 1 tablet (50 mg total) by mouth once for 1 dose. 01/09/19 01/09/19  Rise Mu, PA-C     Positive ROS: positive for hearing loss; negative for ear pain or ear drainage  All other systems have been reviewed and were otherwise negative with the exception of those mentioned in the HPI and as above.  Physical Exam: General: Alert, no acute distress Ears: He has small ear canals bilaterally. Right ear canal clear with an intact, clear TM. Left ear canal is full of wax. After cleaning, left EAC clear with intact, clear TM. Nasal: Clear nasal passages Oral: Clear oropharynx Neck: No palpable adenopathy or masses  Cerumen impaction removal  Date/Time: 09/26/2019 2:50 PM Performed by: Lucilla Edin, PA-C Authorized by: Rozetta Nunnery, MD   Consent:    Consent obtained:  Verbal   Consent given by:  Patient   Alternatives discussed:  No treatment Procedure details:    Location:  L ear   Procedure type: suction     Procedure type comment:  Hydrogen peroxide Post-procedure details:    Inspection:  TM intact and canal normal   Hearing quality:  Improved   Patient tolerance of procedure:  Tolerated well, no immediate complications    Assessment: Cerumen impaction, left ear  Plan: Ear cleaned in office with subjective improvement in symptoms and return of hearing. Resume use of hearing aids. He will return as needed.   Eiley Mcginnity, PA-C   I agree with the assessment and plan as outlined above. Radene Journey, MD

## 2019-09-29 ENCOUNTER — Encounter (INDEPENDENT_AMBULATORY_CARE_PROVIDER_SITE_OTHER): Payer: Self-pay | Admitting: Otolaryngology

## 2019-10-26 DIAGNOSIS — M5033 Other cervical disc degeneration, cervicothoracic region: Secondary | ICD-10-CM | POA: Diagnosis not present

## 2019-11-03 IMAGING — CT CT HEAR MORPH WITH CTA COR WITH SCORE WITH CA WITH CONTRAST AND
4 of 7 series · 8 of 20 positions shown, 9 images · non-contrast
Comparison: 07/14/2017
COMPARISON: 07/14/2017

Addendum:
EXAM:
OVER-READ INTERPRETATION  CT CHEST

The following report is an over-read performed by radiologist Dr.
Anneris Sempai [REDACTED] on 03/29/2019. This over-read
does not include interpretation of cardiac or coronary anatomy or
pathology. The coronary CTA interpretation by the cardiologist is
attached.
TECHNIQUE: The patient was scanned on a Siemens [REDACTED]ice scanner. Gantry
rotation speed was 250 msecs. Collimation was 0.6 mm. A 100 kV
prospective scan was triggered in the ascending thoracic aorta at
35-75% of the R-R interval. Average HR during the scan was 60 bpm.
The 3D data set was interpreted on a dedicated work station using
MPR, MIP and VRT modes. A total of 80cc of contrast was used.

[Series 7: best diast 73 % · axial · 0.39mm/px · z∈[+1182,+1223]mm · 2 of 308 slices shown, 3 images]
[im 103/308  vessel]
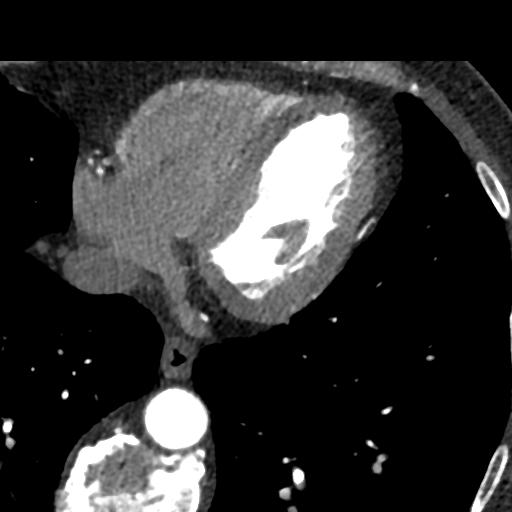
[im 103/308  lung]
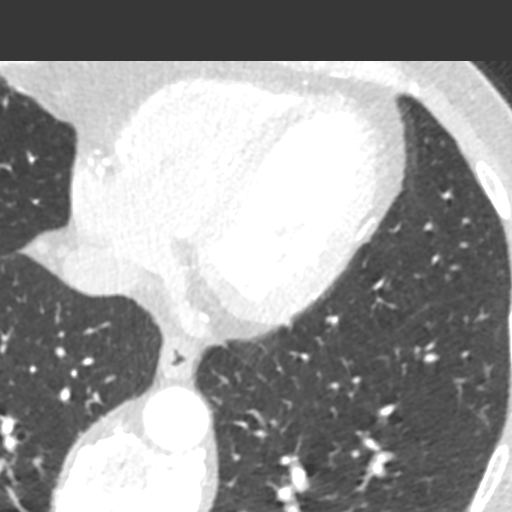
[im 205/308  vessel]
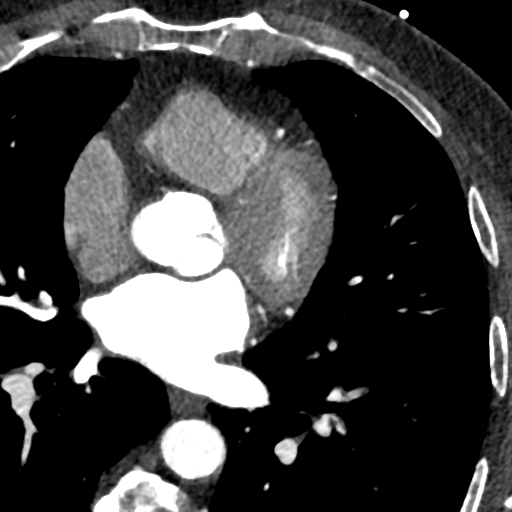

[Series 8: best syst 32 % · axial · 0.39mm/px · z∈[+1182,+1223]mm · 2 of 308 slices shown]
[im 103/308  vessel]
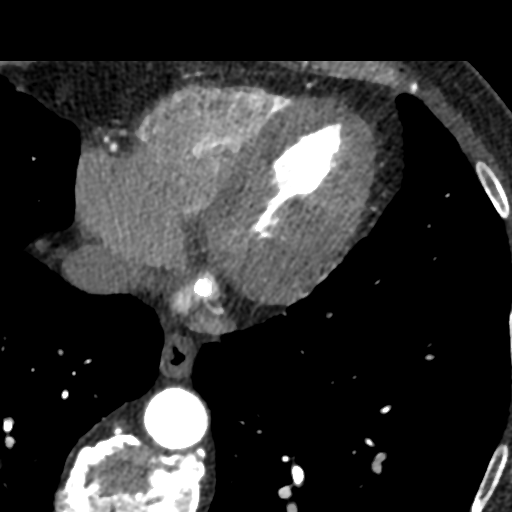
[im 205/308  vessel]
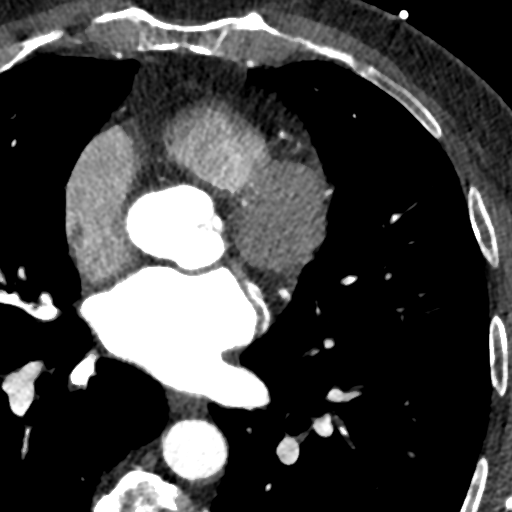

[Series 9: ts diast sharp 73 % · axial · 0.39mm/px · z∈[+1182,+1223]mm · 2 of 308 slices shown]
[im 103/308  lung]
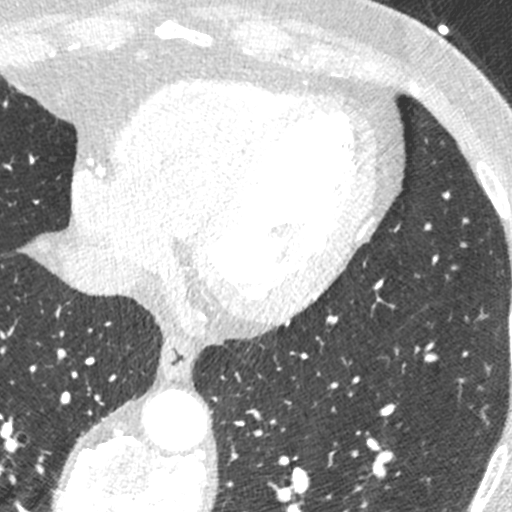
[im 205/308  lung]
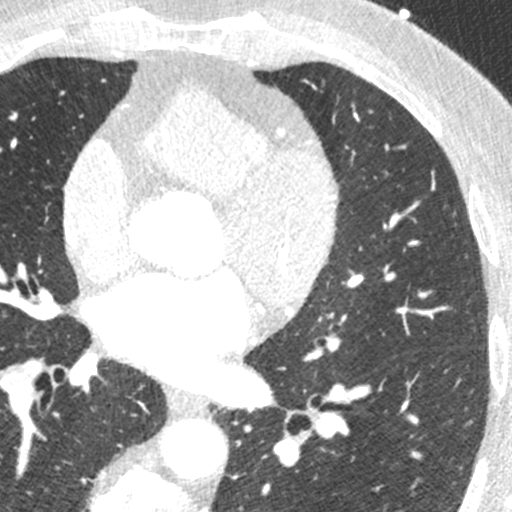

[Series 10: ts syst sharp 32 % · axial · 0.39mm/px · z∈[+1182,+1223]mm · 2 of 308 slices shown]
[im 103/308  lung]
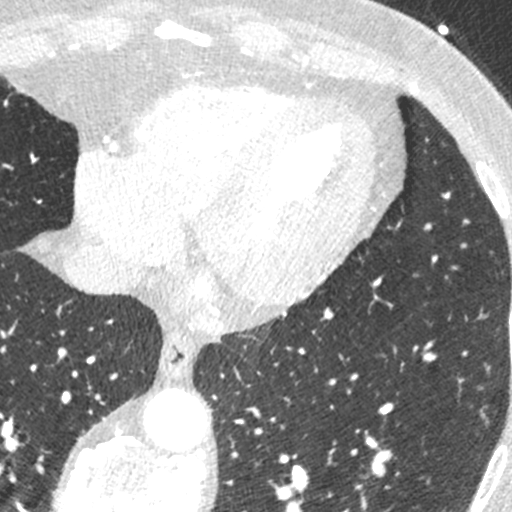
[im 205/308  lung]
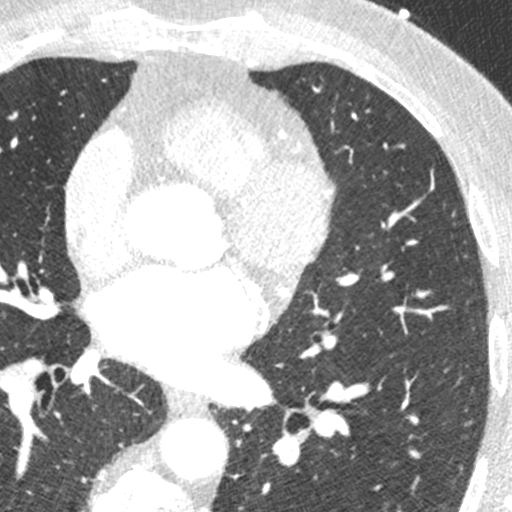

[8 of 20 positions shown; findings below may reference images not displayed]

FINDINGS: Vascular: Heart is normal size.  Visualized aorta is normal caliber.

Mediastinum/Nodes: No adenopathy in the lower mediastinum or hila.

Lungs/Pleura: 5 mm subpleural nodule in the lingula on image 18,
stable since prior study. Lungs otherwise clear. No effusions.

Upper Abdomen: Imaging into the upper abdomen shows no acute
findings.

Musculoskeletal: Chest wall soft tissues are unremarkable. No acute
bony abnormality.
IMPRESSION: No acute extra cardiac abnormality.

Stable 5 mm nodule within the lingula. No follow-up needed if
patient is low-risk. Non-contrast chest CT can be considered in 12
months if patient is high-risk. This recommendation follows the
consensus statement: Guidelines for Management of Incidental
Pulmonary Nodules Detected on CT Images: From the [REDACTED]AL DATA:  Chest pain

EXAM:
Cardiac CTA

MEDICATIONS:
Sub lingual nitro. 4mg x 2
FINDINGS: Non-cardiac: See separate report from [REDACTED].

Pulmonary veins drain normally to the left atrium.

Calcium Score: 13 Agatston units.

Coronary Arteries: Right dominant with no anomalies

LM: No plaque or stenosis.

LAD system: Soft plaque proximal LAD with mild (<50%) stenosis.

Circumflex system: No plaque or stenosis.

RCA system: Mixed plaque proximal RCA, minimal stenosis.
IMPRESSION: 1. Coronary artery calcium score 13 Agatston units. This places the
patient in the 16th percentile for age and gender, suggesting low
risk for future cardiac events.

2.  Nonobstructive coronary disease.

[place]

*** End of Addendum ***
EXAM:
OVER-READ INTERPRETATION  CT CHEST

The following report is an over-read performed by radiologist Dr.
Anneris Sempai [REDACTED] on 03/29/2019. This over-read
does not include interpretation of cardiac or coronary anatomy or
pathology. The coronary CTA interpretation by the cardiologist is
attached.
FINDINGS: Vascular: Heart is normal size.  Visualized aorta is normal caliber.

Mediastinum/Nodes: No adenopathy in the lower mediastinum or hila.

Lungs/Pleura: 5 mm subpleural nodule in the lingula on image 18,
stable since prior study. Lungs otherwise clear. No effusions.

Upper Abdomen: Imaging into the upper abdomen shows no acute
findings.

Musculoskeletal: Chest wall soft tissues are unremarkable. No acute
bony abnormality.
IMPRESSION: No acute extra cardiac abnormality.

Stable 5 mm nodule within the lingula. No follow-up needed if
patient is low-risk. Non-contrast chest CT can be considered in 12
months if patient is high-risk. This recommendation follows the
consensus statement: Guidelines for Management of Incidental
Pulmonary Nodules Detected on CT Images: From the [HOSPITAL]

## 2019-11-14 ENCOUNTER — Ambulatory Visit (INDEPENDENT_AMBULATORY_CARE_PROVIDER_SITE_OTHER): Payer: Medicare Other | Admitting: Otolaryngology

## 2019-12-26 DIAGNOSIS — R972 Elevated prostate specific antigen [PSA]: Secondary | ICD-10-CM | POA: Diagnosis not present

## 2020-01-18 DIAGNOSIS — R972 Elevated prostate specific antigen [PSA]: Secondary | ICD-10-CM | POA: Diagnosis not present

## 2020-01-18 DIAGNOSIS — N401 Enlarged prostate with lower urinary tract symptoms: Secondary | ICD-10-CM | POA: Diagnosis not present

## 2020-01-18 DIAGNOSIS — R351 Nocturia: Secondary | ICD-10-CM | POA: Diagnosis not present

## 2020-02-08 DIAGNOSIS — M5033 Other cervical disc degeneration, cervicothoracic region: Secondary | ICD-10-CM | POA: Diagnosis not present

## 2020-06-24 DIAGNOSIS — Z23 Encounter for immunization: Secondary | ICD-10-CM | POA: Diagnosis not present

## 2020-07-01 ENCOUNTER — Emergency Department (HOSPITAL_COMMUNITY): Payer: Medicare Other

## 2020-07-01 ENCOUNTER — Emergency Department (HOSPITAL_COMMUNITY)
Admission: EM | Admit: 2020-07-01 | Discharge: 2020-07-01 | Disposition: A | Discharge status: Home / Self Care | Payer: Medicare Other | Attending: Emergency Medicine | Admitting: Emergency Medicine

## 2020-07-01 ENCOUNTER — Other Ambulatory Visit: Payer: Self-pay

## 2020-07-01 DIAGNOSIS — Y92410 Unspecified street and highway as the place of occurrence of the external cause: Secondary | ICD-10-CM | POA: Diagnosis not present

## 2020-07-01 DIAGNOSIS — R52 Pain, unspecified: Secondary | ICD-10-CM | POA: Diagnosis not present

## 2020-07-01 DIAGNOSIS — Z23 Encounter for immunization: Secondary | ICD-10-CM | POA: Insufficient documentation

## 2020-07-01 DIAGNOSIS — S52502A Unspecified fracture of the lower end of left radius, initial encounter for closed fracture: Secondary | ICD-10-CM | POA: Diagnosis not present

## 2020-07-01 DIAGNOSIS — S0990XA Unspecified injury of head, initial encounter: Secondary | ICD-10-CM | POA: Diagnosis not present

## 2020-07-01 DIAGNOSIS — M25532 Pain in left wrist: Secondary | ICD-10-CM | POA: Insufficient documentation

## 2020-07-01 DIAGNOSIS — M25539 Pain in unspecified wrist: Secondary | ICD-10-CM | POA: Diagnosis not present

## 2020-07-01 DIAGNOSIS — S52592A Other fractures of lower end of left radius, initial encounter for closed fracture: Secondary | ICD-10-CM | POA: Diagnosis not present

## 2020-07-01 DIAGNOSIS — J320 Chronic maxillary sinusitis: Secondary | ICD-10-CM | POA: Diagnosis not present

## 2020-07-01 DIAGNOSIS — Y999 Unspecified external cause status: Secondary | ICD-10-CM | POA: Diagnosis not present

## 2020-07-01 DIAGNOSIS — S6992XA Unspecified injury of left wrist, hand and finger(s), initial encounter: Secondary | ICD-10-CM | POA: Diagnosis present

## 2020-07-01 DIAGNOSIS — J0100 Acute maxillary sinusitis, unspecified: Secondary | ICD-10-CM | POA: Diagnosis not present

## 2020-07-01 DIAGNOSIS — I7090 Unspecified atherosclerosis: Secondary | ICD-10-CM | POA: Diagnosis not present

## 2020-07-01 DIAGNOSIS — S6292XA Unspecified fracture of left wrist and hand, initial encounter for closed fracture: Secondary | ICD-10-CM | POA: Insufficient documentation

## 2020-07-01 DIAGNOSIS — Y9389 Activity, other specified: Secondary | ICD-10-CM | POA: Insufficient documentation

## 2020-07-01 DIAGNOSIS — R0902 Hypoxemia: Secondary | ICD-10-CM | POA: Diagnosis not present

## 2020-07-01 DIAGNOSIS — I10 Essential (primary) hypertension: Secondary | ICD-10-CM | POA: Diagnosis not present

## 2020-07-01 DIAGNOSIS — S62102A Fracture of unspecified carpal bone, left wrist, initial encounter for closed fracture: Secondary | ICD-10-CM

## 2020-07-01 MED ORDER — TETANUS-DIPHTH-ACELL PERTUSSIS 5-2.5-18.5 LF-MCG/0.5 IM SUSP
0.5000 mL | Freq: Once | INTRAMUSCULAR | Status: AC
  Administered 2020-07-01: 0.5 mL via INTRAMUSCULAR
  Filled 2020-07-01: qty 0.5

## 2020-07-01 MED ORDER — HYDROCODONE-ACETAMINOPHEN 5-325 MG PO TABS: 1.0000 | ORAL_TABLET | Freq: Four times a day (QID) | ORAL | 0 refills | Status: DC | PRN

## 2020-07-01 MED ORDER — MORPHINE SULFATE (PF) 4 MG/ML IV SOLN
4.0000 mg | Freq: Once | INTRAVENOUS | Status: AC
  Administered 2020-07-01: 4 mg via INTRAVENOUS
  Filled 2020-07-01: qty 1

## 2020-07-01 NOTE — Progress Notes (Signed)
Orthopedic Tech Progress Note Patient Details:  [name] 1946-04-21 [id number]  Ortho Devices Type of Ortho Device: Arm sling, Sugartong splint Ortho Device/Splint Location: ULE Ortho Device/Splint Interventions: Application, Ordered   Post Interventions Patient Tolerated: Well Instructions Provided: Care of device   Pansie Guggisberg A Melisha Eggleton 07/01/2020, 12:48 PM
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
## 2020-07-02 DIAGNOSIS — S52572A Other intraarticular fracture of lower end of left radius, initial encounter for closed fracture: Secondary | ICD-10-CM | POA: Diagnosis not present

## 2020-07-11 DIAGNOSIS — S52572D Other intraarticular fracture of lower end of left radius, subsequent encounter for closed fracture with routine healing: Secondary | ICD-10-CM | POA: Diagnosis not present

## 2020-07-18 DIAGNOSIS — M65321 Trigger finger, right index finger: Secondary | ICD-10-CM | POA: Diagnosis not present

## 2020-07-18 DIAGNOSIS — S52572D Other intraarticular fracture of lower end of left radius, subsequent encounter for closed fracture with routine healing: Secondary | ICD-10-CM | POA: Diagnosis not present

## 2020-07-25 DIAGNOSIS — M5030 Other cervical disc degeneration, unspecified cervical region: Secondary | ICD-10-CM | POA: Diagnosis not present

## 2020-08-20 DIAGNOSIS — S52572D Other intraarticular fracture of lower end of left radius, subsequent encounter for closed fracture with routine healing: Secondary | ICD-10-CM | POA: Diagnosis not present

## 2020-08-22 DIAGNOSIS — M5033 Other cervical disc degeneration, cervicothoracic region: Secondary | ICD-10-CM | POA: Diagnosis not present

## 2020-09-05 DIAGNOSIS — R972 Elevated prostate specific antigen [PSA]: Secondary | ICD-10-CM | POA: Diagnosis not present

## 2020-09-18 DIAGNOSIS — R972 Elevated prostate specific antigen [PSA]: Secondary | ICD-10-CM | POA: Diagnosis not present

## 2020-09-18 DIAGNOSIS — N401 Enlarged prostate with lower urinary tract symptoms: Secondary | ICD-10-CM | POA: Diagnosis not present

## 2020-09-18 DIAGNOSIS — R351 Nocturia: Secondary | ICD-10-CM | POA: Diagnosis not present

## 2020-09-19 DIAGNOSIS — M65321 Trigger finger, right index finger: Secondary | ICD-10-CM | POA: Diagnosis not present

## 2020-09-19 DIAGNOSIS — M65322 Trigger finger, left index finger: Secondary | ICD-10-CM | POA: Diagnosis not present

## 2020-09-19 DIAGNOSIS — S52572D Other intraarticular fracture of lower end of left radius, subsequent encounter for closed fracture with routine healing: Secondary | ICD-10-CM | POA: Diagnosis not present

## 2020-12-11 DIAGNOSIS — Z20822 Contact with and (suspected) exposure to covid-19: Secondary | ICD-10-CM | POA: Diagnosis not present

## 2021-02-11 DIAGNOSIS — M5412 Radiculopathy, cervical region: Secondary | ICD-10-CM | POA: Diagnosis not present

## 2021-04-07 DIAGNOSIS — Z6831 Body mass index (BMI) 31.0-31.9, adult: Secondary | ICD-10-CM | POA: Diagnosis not present

## 2021-04-07 DIAGNOSIS — M79672 Pain in left foot: Secondary | ICD-10-CM | POA: Diagnosis not present

## 2021-04-07 DIAGNOSIS — S92503K Displaced unspecified fracture of unspecified lesser toe(s), subsequent encounter for fracture with nonunion: Secondary | ICD-10-CM | POA: Diagnosis not present

## 2021-04-08 DIAGNOSIS — L720 Epidermal cyst: Secondary | ICD-10-CM | POA: Diagnosis not present

## 2021-04-08 DIAGNOSIS — D485 Neoplasm of uncertain behavior of skin: Secondary | ICD-10-CM | POA: Diagnosis not present

## 2021-04-08 DIAGNOSIS — D225 Melanocytic nevi of trunk: Secondary | ICD-10-CM | POA: Diagnosis not present

## 2021-04-08 DIAGNOSIS — L821 Other seborrheic keratosis: Secondary | ICD-10-CM | POA: Diagnosis not present

## 2021-04-08 DIAGNOSIS — L570 Actinic keratosis: Secondary | ICD-10-CM | POA: Diagnosis not present

## 2021-05-06 DIAGNOSIS — H25813 Combined forms of age-related cataract, bilateral: Secondary | ICD-10-CM | POA: Diagnosis not present

## 2021-05-15 DIAGNOSIS — M654 Radial styloid tenosynovitis [de Quervain]: Secondary | ICD-10-CM | POA: Diagnosis not present

## 2021-06-17 DIAGNOSIS — M5412 Radiculopathy, cervical region: Secondary | ICD-10-CM | POA: Diagnosis not present

## 2021-06-19 DIAGNOSIS — M654 Radial styloid tenosynovitis [de Quervain]: Secondary | ICD-10-CM | POA: Diagnosis not present
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
## 2021-06-30 ENCOUNTER — Encounter: Payer: Self-pay | Admitting: Cardiovascular Disease

## 2021-06-30 ENCOUNTER — Other Ambulatory Visit: Payer: Self-pay

## 2021-06-30 ENCOUNTER — Ambulatory Visit (INDEPENDENT_AMBULATORY_CARE_PROVIDER_SITE_OTHER): Payer: Medicare Other | Admitting: Cardiovascular Disease

## 2021-06-30 VITALS — BP 126/66 | HR 73 | Ht 66.5 in | Wt 188.5 lb

## 2021-06-30 DIAGNOSIS — E782 Mixed hyperlipidemia: Secondary | ICD-10-CM | POA: Diagnosis not present

## 2021-06-30 DIAGNOSIS — I208 Other forms of angina pectoris: Secondary | ICD-10-CM | POA: Diagnosis not present

## 2021-06-30 DIAGNOSIS — E7800 Pure hypercholesterolemia, unspecified: Secondary | ICD-10-CM | POA: Diagnosis not present

## 2021-06-30 NOTE — Patient Instructions (Addendum)
Medication Instructions:  No changes  If you need a refill on your cardiac medications before your next appointment, please call your pharmacy.   Lab work: No new labs needed  Testing/Procedures: No new testing needed  Follow-Up: At [facility], you and your health needs are our priority.  As part of our continuing mission to provide you with exceptional heart care, we have created designated Provider Care Teams.  These Care Teams include your primary Cardiologist (physician) and Advanced Practice Providers (APPs -  Physician Assistants and Nurse Practitioners) who all work together to provide you with the care you need, when you need it.  You will need a follow up appointment as needed  Providers on your designated Care Team:   Murray Hodgkins, NP Christell Faith, PA-C Marrianne Mood, PA-C Cadence Seabrook, [place]   COVID-19 Vaccine Information can be found at: ShippingScam.co.uk For questions related to vaccine distribution or appointments, please email [email or web address] or call [phone].

## 2021-07-07 DIAGNOSIS — M5412 Radiculopathy, cervical region: Secondary | ICD-10-CM | POA: Diagnosis not present

## 2021-07-30 DIAGNOSIS — M542 Cervicalgia: Secondary | ICD-10-CM | POA: Diagnosis not present

## 2021-07-30 DIAGNOSIS — M5412 Radiculopathy, cervical region: Secondary | ICD-10-CM | POA: Diagnosis not present

## 2021-08-13 DIAGNOSIS — M542 Cervicalgia: Secondary | ICD-10-CM | POA: Diagnosis not present

## 2021-08-13 DIAGNOSIS — M5412 Radiculopathy, cervical region: Secondary | ICD-10-CM | POA: Diagnosis not present

## 2021-09-08 DIAGNOSIS — M5412 Radiculopathy, cervical region: Secondary | ICD-10-CM | POA: Diagnosis not present

## 2021-09-08 DIAGNOSIS — M542 Cervicalgia: Secondary | ICD-10-CM | POA: Diagnosis not present

## 2021-09-08 DIAGNOSIS — M5030 Other cervical disc degeneration, unspecified cervical region: Secondary | ICD-10-CM | POA: Diagnosis not present

## 2021-09-09 DIAGNOSIS — R972 Elevated prostate specific antigen [PSA]: Secondary | ICD-10-CM | POA: Diagnosis not present

## 2021-09-17 DIAGNOSIS — R972 Elevated prostate specific antigen [PSA]: Secondary | ICD-10-CM | POA: Diagnosis not present

## 2021-09-17 DIAGNOSIS — N401 Enlarged prostate with lower urinary tract symptoms: Secondary | ICD-10-CM | POA: Diagnosis not present

## 2021-09-17 DIAGNOSIS — R351 Nocturia: Secondary | ICD-10-CM | POA: Diagnosis not present

## 2021-10-22 ENCOUNTER — Other Ambulatory Visit: Payer: Self-pay | Admitting: Family Medicine

## 2021-10-22 DIAGNOSIS — R1032 Left lower quadrant pain: Secondary | ICD-10-CM

## 2021-10-22 DIAGNOSIS — Z23 Encounter for immunization: Secondary | ICD-10-CM | POA: Diagnosis not present

## 2022-04-07 DIAGNOSIS — L821 Other seborrheic keratosis: Secondary | ICD-10-CM | POA: Diagnosis not present

## 2022-04-07 DIAGNOSIS — L570 Actinic keratosis: Secondary | ICD-10-CM | POA: Diagnosis not present

## 2022-04-07 DIAGNOSIS — L814 Other melanin hyperpigmentation: Secondary | ICD-10-CM | POA: Diagnosis not present

## 2022-04-07 DIAGNOSIS — D225 Melanocytic nevi of trunk: Secondary | ICD-10-CM | POA: Diagnosis not present

## 2022-05-14 DIAGNOSIS — H35373 Puckering of macula, bilateral: Secondary | ICD-10-CM | POA: Diagnosis not present

## 2022-06-29 DIAGNOSIS — M5412 Radiculopathy, cervical region: Secondary | ICD-10-CM | POA: Diagnosis not present

## 2022-07-03 DIAGNOSIS — Z1331 Encounter for screening for depression: Secondary | ICD-10-CM | POA: Diagnosis not present

## 2022-07-03 DIAGNOSIS — R1032 Left lower quadrant pain: Secondary | ICD-10-CM | POA: Diagnosis not present

## 2022-07-03 DIAGNOSIS — Z6831 Body mass index (BMI) 31.0-31.9, adult: Secondary | ICD-10-CM | POA: Diagnosis not present

## 2022-07-03 DIAGNOSIS — Z9181 History of falling: Secondary | ICD-10-CM | POA: Diagnosis not present

## 2022-08-07 DIAGNOSIS — H2512 Age-related nuclear cataract, left eye: Secondary | ICD-10-CM | POA: Diagnosis not present

## 2022-08-07 DIAGNOSIS — H25013 Cortical age-related cataract, bilateral: Secondary | ICD-10-CM | POA: Diagnosis not present

## 2022-08-07 DIAGNOSIS — H25043 Posterior subcapsular polar age-related cataract, bilateral: Secondary | ICD-10-CM | POA: Diagnosis not present

## 2022-08-07 DIAGNOSIS — H18413 Arcus senilis, bilateral: Secondary | ICD-10-CM | POA: Diagnosis not present

## 2022-08-07 DIAGNOSIS — H2513 Age-related nuclear cataract, bilateral: Secondary | ICD-10-CM | POA: Diagnosis not present

## 2022-09-09 DIAGNOSIS — R972 Elevated prostate specific antigen [PSA]: Secondary | ICD-10-CM | POA: Diagnosis not present

## 2022-09-15 DIAGNOSIS — C4441 Basal cell carcinoma of skin of scalp and neck: Secondary | ICD-10-CM | POA: Diagnosis not present

## 2022-09-15 DIAGNOSIS — D492 Neoplasm of unspecified behavior of bone, soft tissue, and skin: Secondary | ICD-10-CM | POA: Diagnosis not present

## 2022-09-16 DIAGNOSIS — R972 Elevated prostate specific antigen [PSA]: Secondary | ICD-10-CM | POA: Diagnosis not present

## 2022-09-16 DIAGNOSIS — N401 Enlarged prostate with lower urinary tract symptoms: Secondary | ICD-10-CM | POA: Diagnosis not present

## 2022-09-16 DIAGNOSIS — R351 Nocturia: Secondary | ICD-10-CM | POA: Diagnosis not present

## 2022-10-20 DIAGNOSIS — C4441 Basal cell carcinoma of skin of scalp and neck: Secondary | ICD-10-CM | POA: Diagnosis not present

## 2022-10-23 ENCOUNTER — Other Ambulatory Visit: Payer: Self-pay

## 2022-10-23 ENCOUNTER — Emergency Department (HOSPITAL_BASED_OUTPATIENT_CLINIC_OR_DEPARTMENT_OTHER): Payer: Medicare Other | Admitting: Radiology

## 2022-10-23 ENCOUNTER — Emergency Department (HOSPITAL_BASED_OUTPATIENT_CLINIC_OR_DEPARTMENT_OTHER)
Admission: EM | Admit: 2022-10-23 | Discharge: 2022-10-23 | Disposition: A | Discharge status: Home / Self Care | Payer: Medicare Other | Attending: Emergency Medicine | Admitting: Emergency Medicine

## 2022-10-23 ENCOUNTER — Encounter (HOSPITAL_BASED_OUTPATIENT_CLINIC_OR_DEPARTMENT_OTHER): Payer: Self-pay | Admitting: *Deleted

## 2022-10-23 DIAGNOSIS — R0789 Other chest pain: Secondary | ICD-10-CM

## 2022-10-23 DIAGNOSIS — R002 Palpitations: Secondary | ICD-10-CM | POA: Diagnosis not present

## 2022-10-23 DIAGNOSIS — K30 Functional dyspepsia: Secondary | ICD-10-CM | POA: Insufficient documentation

## 2022-10-23 DIAGNOSIS — R079 Chest pain, unspecified: Secondary | ICD-10-CM | POA: Diagnosis not present

## 2022-10-23 LAB — CBC WITH DIFFERENTIAL/PLATELET
Abs Immature Granulocytes: 0.05 10*3/uL (ref 0.00–0.07)
Basophils Absolute: 0 10*3/uL (ref 0.0–0.1)
Basophils Relative: 1 %
Eosinophils Absolute: 0.1 10*3/uL (ref 0.0–0.5)
Eosinophils Relative: 1 %
HCT: 42.3 % (ref 39.0–52.0)
Hemoglobin: 14.3 g/dL (ref 13.0–17.0)
Immature Granulocytes: 1 %
Lymphocytes Relative: 14 %
Lymphs Abs: 1.2 10*3/uL (ref 0.7–4.0)
MCH: 30.6 pg (ref 26.0–34.0)
MCHC: 33.8 g/dL (ref 30.0–36.0)
MCV: 90.6 fL (ref 80.0–100.0)
Monocytes Absolute: 0.6 10*3/uL (ref 0.1–1.0)
Monocytes Relative: 7 %
Neutro Abs: 6.7 10*3/uL (ref 1.7–7.7)
Neutrophils Relative %: 76 %
Platelets: 268 10*3/uL (ref 150–400)
RBC: 4.67 MIL/uL (ref 4.22–5.81)
RDW: 13.2 % (ref 11.5–15.5)
WBC: 8.6 10*3/uL (ref 4.0–10.5)
nRBC: 0 % (ref 0.0–0.2)

## 2022-10-23 LAB — COMPREHENSIVE METABOLIC PANEL
ALT: 22 U/L (ref 0–44)
AST: 19 U/L (ref 15–41)
Albumin: 4.4 g/dL (ref 3.5–5.0)
Alkaline Phosphatase: 50 U/L (ref 38–126)
Anion gap: 12 (ref 5–15)
BUN: 14 mg/dL (ref 8–23)
CO2: 24 mmol/L (ref 22–32)
Calcium: 9.7 mg/dL (ref 8.9–10.3)
Chloride: 104 mmol/L (ref 98–111)
Creatinine, Ser: 0.85 mg/dL (ref 0.61–1.24)
GFR, Estimated: >60 mL/min (ref >60)
Glucose, Bld: 127 mg/dL — ABNORMAL HIGH (ref 70–99)
Potassium: 4.1 mmol/L (ref 3.5–5.1)
Sodium: 140 mmol/L (ref 135–145)
Total Bilirubin: 0.4 mg/dL (ref 0.3–1.2)
Total Protein: 7.4 g/dL (ref 6.5–8.1)

## 2022-10-23 LAB — MAGNESIUM: Magnesium: 2.2 mg/dL (ref 1.7–2.4)

## 2022-10-23 LAB — TROPONIN I (HIGH SENSITIVITY)
Troponin I (High Sensitivity): 5 ng/L (ref <18)
Troponin I (High Sensitivity): 5 ng/L (ref <18)

## 2022-10-23 LAB — LIPASE, BLOOD: Lipase: 35 U/L (ref 11–51)

## 2022-10-23 MED ORDER — PANTOPRAZOLE SODIUM 40 MG IV SOLR
40.0000 mg | Freq: Once | INTRAVENOUS | Status: AC
  Administered 2022-10-23: 40 mg via INTRAVENOUS
  Filled 2022-10-23: qty 10

## 2022-10-23 MED ORDER — ALUM & MAG HYDROXIDE-SIMETH 200-200-20 MG/5ML PO SUSP
30.0000 mL | Freq: Once | ORAL | Status: AC
  Administered 2022-10-23: 30 mL via ORAL
  Filled 2022-10-23: qty 30

## 2022-10-23 MED ORDER — SUCRALFATE 1 G PO TABS: 1.0000 g | ORAL_TABLET | Freq: Three times a day (TID) | ORAL | 0 refills | Status: DC

## 2022-10-23 MED ORDER — LIDOCAINE VISCOUS HCL 2 % MT SOLN
15.0000 mL | Freq: Once | OROMUCOSAL | Status: AC
  Administered 2022-10-23: 15 mL via ORAL
  Filled 2022-10-23: qty 15

## 2022-10-23 NOTE — Discharge Instructions (Addendum)
Return to the Emergency Department if you have unusual chest pain, pressure, or discomfort, shortness of breath, nausea, vomiting, burping, heartburn, tingling upper body parts, sweating, cold, clammy skin, or racing heartbeat. Call 911 if you think you are having a heart attack. Take all cardiac medications as prescribed - notify your doctor if you have any side effects. Follow cardiac diet - avoid fatty & fried foods, don't eat too much red meat, eat lots of fruits & vegetables, and dairy products should be low fat. Please lose weight if you are overweight. Become more active with walking, gardening, or any other activity that gets you to moving.     It was a pleasure caring for you today in the emergency department.  Please return to the emergency department for any worsening or worrisome symptoms.  Please return to the emergency department immediately for any new or concerning symptoms, or if you get worse.

## 2022-10-23 NOTE — ED Provider Notes (Signed)
[facility]ERGENC[facility] DEPT Provider Note   CSN: [id number] Arrival date & time: 10/23/22  1727     History {Add pertinent medical, surgical, social history, OB history to HPI:1} Chief Complaint  Patient presents with   Chest Pain    [name] is a 76 y.o. male.  Patient as above with significant medical history as below, including obesity, HLD, murmur, esophageal stricture, arthritis who presents to the ED with complaint of cp Pt with epigastric/mid sternal chest pain over last 24 hours Initially this morning, burning/pressure sensation, did resolve spontaneously after a few hours. Was seen at [facility] and told to go to ED if symptoms returned After eating his symptoms returned Similar pattern as prev No n/v/ no dib/ no fever/ no melena or brbpr Not worse w/ exertion, no DOE, no orthopnea No medications PTA Feels similar to prior indigestion but slightly different.      Past Medical History:  Diagnosis Date   Actinic keratosis 11/30/2007   Qualifier: Diagnosis of  By: Diona Browner MD, Amy     ALLERGIC RHINITIS 11/30/2007   Qualifier: Diagnosis of  By: Diona Browner MD, Amy     Arthritis    Burning chest pain 12/02/2011   Chest pain 11/26/2011   Esophageal stricture    EGD 11/07.  stricture dilated.  hiatal hernia, gastritis   FATIGUE, CHRONIC 09/10/2008   Qualifier: Diagnosis of  By: Diona Browner MD, Amy     GERD 11/30/2007   Qualifier: Diagnosis of  By: Diona Browner MD, Amy     Hyperlipidemia 06/13/2013   Murmur, cardiac 11/04/2012   NEOPLASM, SKIN, UNCERTAIN BEHAVIOR 04/18/3353   Qualifier: Diagnosis of  By: Diona Browner MD, Amy     OBESITY 11/30/2007   Qualifier: Diagnosis of  By: Diona Browner MD, Amy     Prediabetes 11/27/2011   ROTATOR CUFF SYNDROME 11/30/2007   Qualifier: Diagnosis of  By: Diona Browner MD, Amy     SKIN LESION, BENIGN 02/15/2008   Qualifier: Diagnosis of  By: Diona Browner MD, Amy     SYNCOPE, HX OF 11/30/2007   Qualifier: Diagnosis of  By: Diona Browner MD, Amy     Tick bite 04/14/2016     Past Surgical History:  Procedure Laterality Date   SKIN CANCER EXCISION       The history is provided by the patient and the spouse. No language interpreter was used.  Chest Pain Associated symptoms: no abdominal pain, no cough, no dysphagia, no fever, no headache, no nausea, no palpitations, no shortness of breath and no vomiting        Home Medications Prior to Admission medications   Medication Sig Start Date End Date Taking? Authorizing Provider  Calcium-Magnesium-Zinc (CAL-MAG-ZINC PO) Take 1 tablet by mouth daily.    [provider]  cholecalciferol (VITAMIN D3) 25 MCG (1000 UNIT) tablet Take 1,000 Units by mouth daily.    [provider]  esomeprazole (NEXIUM) 40 MG capsule Take 40 mg by mouth daily at 12 noon.    [provider]  finasteride (PROSCAR) 5 MG tablet Take 5 mg by mouth daily.  12/03/18   [provider]  gabapentin (NEURONTIN) 300 MG capsule Take 300 mg by mouth at bedtime.  12/06/18   [provider]  HYDROcodone-acetaminophen (NORCO/VICODIN) 5-325 MG tablet Take 1 tablet by mouth every 6 (six) hours as needed. Patient not taking: Reported on 06/30/2021 07/01/20   Virgel Manifold, MD  ibuprofen (ADVIL) 200 MG tablet Take 200 mg by mouth every 6 (six) hours as needed for  mild pain.    [provider]  magnesium 30 MG tablet Take 30 mg by mouth daily.    [provider]  melatonin 3 MG TABS tablet Take 4.5 mg by mouth at bedtime.     [provider]  Misc Natural Products (PROSTATE) CAPS Take 1 capsule by mouth daily.    [provider]  Multiple Vitamin (MULTIVITAMIN WITH MINERALS) TABS tablet Take 1 tablet by mouth daily.    [provider]  Multiple Vitamins-Minerals (MULTIVITAMIN ADULT) TABS Take 1 tablet by mouth daily.    [provider]  vitamin B-12 (CYANOCOBALAMIN) 1000 MCG tablet Take 1,000 mcg by mouth daily.    [provider]      Allergies     Patient has no known allergies.    Review of Systems   Review of Systems  Constitutional:  Negative for chills and fever.  HENT:  Negative for facial swelling and trouble swallowing.   Eyes:  Negative for photophobia and visual disturbance.  Respiratory:  Negative for cough and shortness of breath.   Cardiovascular:  Positive for chest pain. Negative for palpitations.  Gastrointestinal:  Negative for abdominal pain, nausea and vomiting.  Endocrine: Negative for polydipsia and polyuria.  Genitourinary:  Negative for difficulty urinating and hematuria.  Musculoskeletal:  Negative for gait problem and joint swelling.  Skin:  Negative for pallor and rash.  Neurological:  Negative for syncope and headaches.  Psychiatric/Behavioral:  Negative for agitation and confusion.     Physical Exam Updated Vital Signs BP (!) 168/86 (BP Location: Right Arm)   Pulse 73   Temp 98.4 F (36.9 C)   Resp 18   SpO2 98%  Physical Exam Vitals and nursing note reviewed.  Constitutional:      General: He is not in acute distress.    Appearance: Normal appearance. He is well-developed. He is not ill-appearing.  HENT:     Head: Normocephalic and atraumatic.     Right Ear: External ear normal.     Left Ear: External ear normal.     Mouth/Throat:     Mouth: Mucous membranes are moist.  Eyes:     General: No scleral icterus. Cardiovascular:     Rate and Rhythm: Normal rate and regular rhythm.     Pulses: Normal pulses.          Radial pulses are 2+ on the right side and 2+ on the left side.     Heart sounds: Murmur heard.     Systolic murmur is present.     No S3 or S4 sounds.  Pulmonary:     Effort: Pulmonary effort is normal. No respiratory distress.     Breath sounds: Normal breath sounds. No stridor.  Abdominal:     General: Abdomen is flat.     Palpations: Abdomen is soft.     Tenderness: There is no abdominal tenderness. There is no guarding or rebound.  Musculoskeletal:        General:  Normal range of motion.     Cervical back: Normal range of motion.     Right lower leg: No edema.     Left lower leg: No edema.  Skin:    General: Skin is warm and dry.     Capillary Refill: Capillary refill takes less than 2 seconds.  Neurological:     Mental Status: He is alert and oriented to person, place, and time.     GCS: GCS eye subscore is 4. GCS  verbal subscore is 5. GCS motor subscore is 6.  Psychiatric:        Mood and Affect: Mood normal.        Behavior: Behavior normal.     ED Results / Procedures / Treatments   Labs (all labs ordered are listed, but only abnormal results are displayed) Labs Reviewed  MAGNESIUM  COMPREHENSIVE METABOLIC PANEL  LIPASE, BLOOD  CBC WITH DIFFERENTIAL/PLATELET  TROPONIN I (HIGH SENSITIVITY)    EKG EKG Interpretation  Date/Time:  Friday October 23 2022 17:36:55 EST Ventricular Rate:  71 PR Interval:  198 QRS Duration: 116 QT Interval:  386 QTC Calculation: 419 R Axis:   23 Text Interpretation: Normal sinus rhythm Cannot rule out Anterior infarct , age undetermined Abnormal ECG When compared with ECG of 15-Dec-2018 23:43, PREVIOUS ECG IS PRESENT similar to prior 1/20 Confirmed by Wynona Dove ([id number]) on 10/23/2022 5:45:59 PM  Radiology DG Chest 2 View  Result Date: 10/23/2022 CLINICAL DATA:  Chest pain. EXAM: CHEST - 2 VIEW COMPARISON:  Chest two views 12/15/2018 FINDINGS: Findings cardiac silhouette and mediastinal contours are within normal limits. Redemonstration of normal variant azygous lobe. The lungs are clear. No pleural effusion pneumothorax. Moderate multilevel degenerative disc changes of the thoracic spine. IMPRESSION: No active cardiopulmonary disease. Electronically Signed   By: Yvonne Kendall M.D.   On: 10/23/2022 18:04    Procedures Procedures  {Document cardiac monitor, telemetry assessment procedure when appropriate:1}  Medications Ordered in ED Medications - No data to display  ED Course/ Medical Decision  Making/ A&P                           Medical Decision Making Amount and/or Complexity of Data Reviewed Labs: ordered. Radiology: ordered.  Risk OTC drugs. Prescription drug management.   This patient presents to the ED with chief complaint(s) of cp with pertinent past medical history of as above which further complicates the presenting complaint. The complaint involves an extensive differential diagnosis and also carries with it a high risk of complications and morbidity.     Differential includes all life-threatening causes for chest pain. This includes but is not exclusive to acute coronary syndrome, aortic dissection, pulmonary embolism, cardiac tamponade, community-acquired pneumonia, pericarditis, musculoskeletal chest wall pain, etc. . Serious etiologies were considered.   The initial plan is to screening labs/imaging/gi cocktail   Additional history obtained: Additional history obtained from family Records reviewed Primary Care Documents and prior labs/imaging Prior CT coronary 5/20 with calcium score of 13   Independent labs interpretation:  The following labs were independently interpreted: ***  Independent visualization of imaging: - I independently visualized the following imaging with scope of interpretation limited to determining acute life threatening conditions related to emergency care: ***, which revealed ***  Cardiac monitoring was reviewed and interpreted by myself which shows ***  Treatment and Reassessment: ***  Consultation: - Consulted or discussed management/test interpretation w/ external professional: ***  Consideration for admission or further workup: Admission was considered ***  Social Determinants of health: Social History   Tobacco Use   Smoking status: Never   Smokeless tobacco: Never  Vaping Use   Vaping Use: Never used  Substance Use Topics   Alcohol use: No   Drug use: No      {Document critical care time when  appropriate:1} {Document review of labs and clinical decision tools ie heart score, Chads2Vasc2 etc:1}  {Document your independent review of radiology images, and any outside  records:1} {Document your discussion with family members, caretakers, and with consultants:1} {Document social determinants of health affecting pt's care:1} {Document your decision making why or why not admission, treatments were needed:1} Final Clinical Impression(s) / ED Diagnoses Final diagnoses:  None    Rx / DC Orders ED Discharge Orders     None

## 2022-10-23 NOTE — ED Triage Notes (Signed)
Pt began having epigastric pain with pain radiating through chest.  Pt denies any sob, nausea or diaphoresis.  Pt was seen at [facility] and had EKG and was told to come here for further evaluation.  Pt states that the pain has gotten worse.

## 2022-11-04 DIAGNOSIS — H25013 Cortical age-related cataract, bilateral: Secondary | ICD-10-CM | POA: Diagnosis not present

## 2022-11-04 DIAGNOSIS — H2512 Age-related nuclear cataract, left eye: Secondary | ICD-10-CM | POA: Diagnosis not present

## 2022-11-04 DIAGNOSIS — H18413 Arcus senilis, bilateral: Secondary | ICD-10-CM | POA: Diagnosis not present

## 2022-11-04 DIAGNOSIS — H25043 Posterior subcapsular polar age-related cataract, bilateral: Secondary | ICD-10-CM | POA: Diagnosis not present

## 2022-11-04 DIAGNOSIS — H2513 Age-related nuclear cataract, bilateral: Secondary | ICD-10-CM | POA: Diagnosis not present

## 2022-11-11 DIAGNOSIS — H2512 Age-related nuclear cataract, left eye: Secondary | ICD-10-CM | POA: Diagnosis not present

## 2022-11-11 DIAGNOSIS — H25013 Cortical age-related cataract, bilateral: Secondary | ICD-10-CM | POA: Diagnosis not present

## 2022-11-11 DIAGNOSIS — H2513 Age-related nuclear cataract, bilateral: Secondary | ICD-10-CM | POA: Diagnosis not present

## 2022-11-11 DIAGNOSIS — H18413 Arcus senilis, bilateral: Secondary | ICD-10-CM | POA: Diagnosis not present

## 2022-11-11 DIAGNOSIS — H25043 Posterior subcapsular polar age-related cataract, bilateral: Secondary | ICD-10-CM | POA: Diagnosis not present

## 2022-12-08 ENCOUNTER — Ambulatory Visit: Payer: Medicare Other | Admitting: Cardiovascular Disease

## 2022-12-16 DIAGNOSIS — M5412 Radiculopathy, cervical region: Secondary | ICD-10-CM | POA: Diagnosis not present

## 2022-12-16 DIAGNOSIS — M5030 Other cervical disc degeneration, unspecified cervical region: Secondary | ICD-10-CM | POA: Diagnosis not present

## 2022-12-22 DIAGNOSIS — H2512 Age-related nuclear cataract, left eye: Secondary | ICD-10-CM | POA: Diagnosis not present

## 2022-12-22 DIAGNOSIS — H2511 Age-related nuclear cataract, right eye: Secondary | ICD-10-CM | POA: Diagnosis not present

## 2022-12-22 DIAGNOSIS — H269 Unspecified cataract: Secondary | ICD-10-CM | POA: Diagnosis not present

## 2022-12-29 DIAGNOSIS — H269 Unspecified cataract: Secondary | ICD-10-CM | POA: Diagnosis not present

## 2022-12-29 DIAGNOSIS — H2511 Age-related nuclear cataract, right eye: Secondary | ICD-10-CM | POA: Diagnosis not present

## 2023-03-10 DIAGNOSIS — Z20822 Contact with and (suspected) exposure to covid-19: Secondary | ICD-10-CM | POA: Diagnosis not present

## 2023-03-10 DIAGNOSIS — Z139 Encounter for screening, unspecified: Secondary | ICD-10-CM | POA: Diagnosis not present

## 2023-03-10 DIAGNOSIS — R6889 Other general symptoms and signs: Secondary | ICD-10-CM | POA: Diagnosis not present

## 2023-03-25 DIAGNOSIS — L570 Actinic keratosis: Secondary | ICD-10-CM | POA: Diagnosis not present

## 2023-03-25 DIAGNOSIS — D225 Melanocytic nevi of trunk: Secondary | ICD-10-CM | POA: Diagnosis not present

## 2023-03-25 DIAGNOSIS — L538 Other specified erythematous conditions: Secondary | ICD-10-CM | POA: Diagnosis not present

## 2023-03-25 DIAGNOSIS — L821 Other seborrheic keratosis: Secondary | ICD-10-CM | POA: Diagnosis not present

## 2023-03-25 DIAGNOSIS — C44629 Squamous cell carcinoma of skin of left upper limb, including shoulder: Secondary | ICD-10-CM | POA: Diagnosis not present

## 2023-03-25 DIAGNOSIS — L814 Other melanin hyperpigmentation: Secondary | ICD-10-CM | POA: Diagnosis not present

## 2023-03-25 DIAGNOSIS — D492 Neoplasm of unspecified behavior of bone, soft tissue, and skin: Secondary | ICD-10-CM | POA: Diagnosis not present

## 2023-04-29 DIAGNOSIS — D0461 Carcinoma in situ of skin of right upper limb, including shoulder: Secondary | ICD-10-CM | POA: Diagnosis not present

## 2023-05-04 DIAGNOSIS — M5412 Radiculopathy, cervical region: Secondary | ICD-10-CM | POA: Diagnosis not present

## 2023-05-06 ENCOUNTER — Emergency Department (HOSPITAL_BASED_OUTPATIENT_CLINIC_OR_DEPARTMENT_OTHER): Payer: Medicare Other

## 2023-05-06 ENCOUNTER — Emergency Department (HOSPITAL_BASED_OUTPATIENT_CLINIC_OR_DEPARTMENT_OTHER)
Admission: EM | Admit: 2023-05-06 | Discharge: 2023-05-06 | Disposition: A | Discharge status: Home / Self Care | Payer: Medicare Other | Attending: Emergency Medicine | Admitting: Emergency Medicine

## 2023-05-06 ENCOUNTER — Encounter (HOSPITAL_BASED_OUTPATIENT_CLINIC_OR_DEPARTMENT_OTHER): Payer: Self-pay | Admitting: Emergency Medicine

## 2023-05-06 DIAGNOSIS — M549 Dorsalgia, unspecified: Secondary | ICD-10-CM | POA: Insufficient documentation

## 2023-05-06 DIAGNOSIS — R0789 Other chest pain: Secondary | ICD-10-CM | POA: Diagnosis not present

## 2023-05-06 DIAGNOSIS — R002 Palpitations: Secondary | ICD-10-CM | POA: Diagnosis not present

## 2023-05-06 DIAGNOSIS — R079 Chest pain, unspecified: Secondary | ICD-10-CM | POA: Diagnosis not present

## 2023-05-06 LAB — BASIC METABOLIC PANEL
Anion gap: 8 (ref 5–15)
BUN: 21 mg/dL (ref 8–23)
CO2: 28 mmol/L (ref 22–32)
Calcium: 9.5 mg/dL (ref 8.9–10.3)
Chloride: 105 mmol/L (ref 98–111)
Creatinine, Ser: 1 mg/dL (ref 0.61–1.24)
GFR, Estimated: >60 mL/min (ref >60)
Glucose, Bld: 95 mg/dL (ref 70–99)
Potassium: 4.9 mmol/L (ref 3.5–5.1)
Sodium: 141 mmol/L (ref 135–145)

## 2023-05-06 LAB — CBC WITH DIFFERENTIAL/PLATELET
Abs Immature Granulocytes: 0.19 10*3/uL — ABNORMAL HIGH (ref 0.00–0.07)
Basophils Absolute: 0.1 10*3/uL (ref 0.0–0.1)
Basophils Relative: 1 %
Eosinophils Absolute: 0.1 10*3/uL (ref 0.0–0.5)
Eosinophils Relative: 1 %
HCT: 42.6 % (ref 39.0–52.0)
Hemoglobin: 14.2 g/dL (ref 13.0–17.0)
Immature Granulocytes: 2 %
Lymphocytes Relative: 25 %
Lymphs Abs: 2.7 10*3/uL (ref 0.7–4.0)
MCH: 30.6 pg (ref 26.0–34.0)
MCHC: 33.3 g/dL (ref 30.0–36.0)
MCV: 91.8 fL (ref 80.0–100.0)
Monocytes Absolute: 0.9 10*3/uL (ref 0.1–1.0)
Monocytes Relative: 8 %
Neutro Abs: 6.7 10*3/uL (ref 1.7–7.7)
Neutrophils Relative %: 63 %
Platelets: 250 10*3/uL (ref 150–400)
RBC: 4.64 MIL/uL (ref 4.22–5.81)
RDW: 13.2 % (ref 11.5–15.5)
WBC: 10.7 10*3/uL — ABNORMAL HIGH (ref 4.0–10.5)
nRBC: 0 % (ref 0.0–0.2)

## 2023-05-06 LAB — MAGNESIUM: Magnesium: 2.2 mg/dL (ref 1.7–2.4)

## 2023-05-06 LAB — TROPONIN I (HIGH SENSITIVITY)
Troponin I (High Sensitivity): 7 ng/L (ref <18)
Troponin I (High Sensitivity): 7 ng/L (ref <18)

## 2023-05-06 NOTE — ED Triage Notes (Signed)
Chest pain and palps coming and goings  Started late afternoon.  Denies sob, no n/v

## 2023-05-06 NOTE — Discharge Instructions (Signed)
Follow up with your cardiologist. Workup today was normal.

## 2023-05-06 NOTE — ED Provider Notes (Signed)
[place] EMERGENCY DEPARTMENT AT [facility] Provider Note   CSN: [id number] Arrival date & time: 05/06/23  1956     History  Chief Complaint  Patient presents with   Chest Pain   Palpitations    [name] is a 77 y.o. male.  Patient here with palpitations.  Started about 3 hours ago.  Little bit of chest discomfort with it but now resolved.  Was feeling some racing heartbeats back-and-forth for a little bit of time there.  No major medical problems.  Denies any history of high blood pressure, diabetes, high cholesterol.  Has some chronic back pain.  Denies any numbness weakness chills.  No recent major surgery or travel.  No blood clot history.  The history is provided by the patient.       Home Medications Prior to Admission medications   Medication Sig Start Date End Date Taking? Authorizing Provider  Calcium-Magnesium-Zinc (CAL-MAG-ZINC PO) Take 1 tablet by mouth daily.    [provider]  cholecalciferol (VITAMIN D3) 25 MCG (1000 UNIT) tablet Take 1,000 Units by mouth daily.    [provider]  esomeprazole (NEXIUM) 40 MG capsule Take 40 mg by mouth daily at 12 noon.    [provider]  finasteride (PROSCAR) 5 MG tablet Take 5 mg by mouth daily.  12/03/18   [provider]  gabapentin (NEURONTIN) 300 MG capsule Take 300 mg by mouth at bedtime.  12/06/18   [provider]  HYDROcodone-acetaminophen (NORCO/VICODIN) 5-325 MG tablet Take 1 tablet by mouth every 6 (six) hours as needed. Patient not taking: Reported on 06/30/2021 07/01/20   Raeford Razor, MD  ibuprofen (ADVIL) 200 MG tablet Take 200 mg by mouth every 6 (six) hours as needed for mild pain.    [provider]  magnesium 30 MG tablet Take 30 mg by mouth daily.    [provider]  melatonin 3 MG TABS tablet Take 4.5 mg by mouth at bedtime.     [provider]  Misc Natural Products (PROSTATE) CAPS Take 1 capsule by mouth daily.     [provider]  Multiple Vitamin (MULTIVITAMIN WITH MINERALS) TABS tablet Take 1 tablet by mouth daily.    [provider]  Multiple Vitamins-Minerals (MULTIVITAMIN ADULT) TABS Take 1 tablet by mouth daily.    [provider]  sucralfate (CARAFATE) 1 g tablet Take 1 tablet (1 g total) by mouth 4 (four) times daily -  with meals and at bedtime for 7 days. 10/23/22 10/30/22  Sloan Leiter, DO  vitamin B-12 (CYANOCOBALAMIN) 1000 MCG tablet Take 1,000 mcg by mouth daily.    [provider]      Allergies    Patient has no known allergies.    Review of Systems   Review of Systems  Physical Exam Updated Vital Signs BP (!) 175/83   Pulse (!) 56   Temp 98.4 F (36.9 C) (Oral)   Resp 14   SpO2 98%  Physical Exam Vitals and nursing note reviewed.  Constitutional:      General: He is not in acute distress.    Appearance: He is well-developed. He is not ill-appearing.  HENT:     Head: Normocephalic and atraumatic.  Eyes:     Extraocular Movements: Extraocular movements intact.     Conjunctiva/sclera: Conjunctivae normal.     Pupils: Pupils are equal, round, and reactive to light.  Cardiovascular:     Rate and Rhythm: Normal rate and regular rhythm.  Pulses:          Radial pulses are 2+ on the right side and 2+ on the left side.     Heart sounds: No murmur heard. Pulmonary:     Effort: Pulmonary effort is normal. No respiratory distress.     Breath sounds: Normal breath sounds.  Abdominal:     Palpations: Abdomen is soft.     Tenderness: There is no abdominal tenderness.  Musculoskeletal:        General: No swelling. Normal range of motion.     Cervical back: Normal range of motion and neck supple.     Right lower leg: No edema.     Left lower leg: No edema.  Skin:    General: Skin is warm and dry.     Capillary Refill: Capillary refill takes less than 2 seconds.  Neurological:     Mental Status: He is alert.  Psychiatric:        Mood  and Affect: Mood normal.     ED Results / Procedures / Treatments   Labs (all labs ordered are listed, but only abnormal results are displayed) Labs Reviewed  CBC WITH DIFFERENTIAL/PLATELET - Abnormal; Notable for the following components:      Result Value   WBC 10.7 (*)    Abs Immature Granulocytes 0.19 (*)    All other components within normal limits  BASIC METABOLIC PANEL  MAGNESIUM  TROPONIN I (HIGH SENSITIVITY)  TROPONIN I (HIGH SENSITIVITY)    EKG EKG Interpretation  Date/Time:  Thursday May 06 2023 20:05:57 EDT Ventricular Rate:  60 PR Interval:  197 QRS Duration: 124 QT Interval:  414 QTC Calculation: 414 R Axis:   33 Text Interpretation: Sinus rhythm Confirmed by Virgina Norfolk ([id number]) on 05/06/2023 8:29:16 PM  Radiology DG Chest Portable 1 View  Result Date: 05/06/2023 CLINICAL DATA:  Chest pain and palpitations EXAM: PORTABLE CHEST 1 VIEW COMPARISON:  10/23/2022 FINDINGS: Small expansile lesion of the left lateral fifth or sixth rib medially, not substantially changed 07/14/2017, hence felt to be benign and not requiring further workup. Azygous fissure noted. Cardiac and mediastinal margins appear normal. The lungs appear clear. No blunting of the costophrenic angles. IMPRESSION: 1. No acute findings. 2. Small expansile lesion of the left lateral fifth or sixth rib medially, not substantially changed 07/14/2017, hence felt to be benign and not requiring further workup. Electronically Signed   By: Gaylyn Rong M.D.   On: 05/06/2023 20:21    Procedures Procedures    Medications Ordered in ED Medications - No data to display  ED Course/ Medical Decision Making/ A&P                             Medical Decision Making Amount and/or Complexity of Data Reviewed Labs: ordered. Radiology: ordered.   [name] is here with palpitations.  Had a little bit of chest discomfort with them but now resolved.  This happened about 3 hours ago.  No major medical  problems.  Vital signs unremarkable.  Does admit to some stress.  Denies any abdominal pain nausea vomiting diarrhea.  Per my review of his chart he did have a coronary CT a few years ago that showed 16 percentile calcium score.  Has never had cardiac issues in the past.  Does not have chest discomfort with exertion.  Differential diagnosis likely PACs or PVCs seems less likely to be SVT or A-fib or atrial flutter.  I have very low suspicion for ACS.  He has no PE risk factors.  Does not have any infectious symptoms.  Could be stress related as well.  Will get CBC, BMP, troponin, chest x-ray.  EKG shows sinus rhythm.  No ischemic changes.  I reviewed and interpreted EKG.  Per my review and interpretation of labs there is no significant anemia, electrolyte abnormality, kidney injury, leukocytosis.  Chest x-ray shows no evidence of pneumonia or pneumothorax.  Troponin negative x 2.  No other episodes while in the ED.  Will have him follow-up with cardiology to maybe consider cardiac monitor.  Discharged in good condition.  Understands return precautions.  This chart was dictated using voice recognition software.  Despite best efforts to proofread,  errors can occur which can change the documentation meaning.         Final Clinical Impression(s) / ED Diagnoses Final diagnoses:  Palpitations    Rx / DC Orders ED Discharge Orders     None         Virgina Norfolk, DO 05/06/23 2211

## 2023-05-12 ENCOUNTER — Telehealth: Payer: Self-pay | Admitting: *Deleted

## 2023-05-12 NOTE — Telephone Encounter (Signed)
Transition Care Management Follow-up Telephone Call Date of discharge and from where: [facility]  How have you been since you were released from the hospital? Feeling ok , probably had a stress  Any questions or concerns? No  Items Reviewed: Did the pt receive and understand the discharge instructions provided? Yes  Medications obtained and verified? No  Other? No  Any new allergies since your discharge? No  Dietary orders reviewed? No Do you have support at home? No     Follow up appointments reviewed:  PCP Hospital f/u appt confirmed? Yes   Are transportation arrangements needed? No  If their condition worsens, is the pt aware to call PCP or go to the Emergency Dept.? Yes  Was the patient provided with contact information for the PCP's office or ED? Yes Was to pt encouraged to call back with questions or concerns? Yes

## 2023-05-14 ENCOUNTER — Ambulatory Visit (INDEPENDENT_AMBULATORY_CARE_PROVIDER_SITE_OTHER): Payer: Medicare Other | Admitting: Podiatry

## 2023-05-14 DIAGNOSIS — M79675 Pain in left toe(s): Secondary | ICD-10-CM

## 2023-05-14 DIAGNOSIS — M79674 Pain in right toe(s): Secondary | ICD-10-CM

## 2023-05-14 DIAGNOSIS — B351 Tinea unguium: Secondary | ICD-10-CM | POA: Diagnosis not present

## 2023-05-14 NOTE — Progress Notes (Signed)
   Chief Complaint  Patient presents with   Nail Problem    Pt is here for possible ingrown he also says he is struggle to walk in shoes because of the thickness of his nails      SUBJECTIVE Patient presents to office today complaining of elongated, thickened nails that cause pain while ambulating in shoes.  Patient is unable to trim their own nails. Patient is here for further evaluation and treatment.  Past Medical History:  Diagnosis Date   Actinic keratosis 11/30/2007   Qualifier: Diagnosis of  By: Ermalene Searing MD, Amy     ALLERGIC RHINITIS 11/30/2007   Qualifier: Diagnosis of  By: Ermalene Searing MD, Amy     Arthritis    Burning chest pain 12/02/2011   Chest pain 11/26/2011   Esophageal stricture    EGD 11/07.  stricture dilated.  hiatal hernia, gastritis   FATIGUE, CHRONIC 09/10/2008   Qualifier: Diagnosis of  By: Ermalene Searing MD, Amy     GERD 11/30/2007   Qualifier: Diagnosis of  By: Ermalene Searing MD, Amy     Hyperlipidemia 06/13/2013   Murmur, cardiac 11/04/2012   NEOPLASM, SKIN, UNCERTAIN BEHAVIOR 02/15/2008   Qualifier: Diagnosis of  By: Ermalene Searing MD, Amy     OBESITY 11/30/2007   Qualifier: Diagnosis of  By: Ermalene Searing MD, Amy     Prediabetes 11/27/2011   ROTATOR CUFF SYNDROME 11/30/2007   Qualifier: Diagnosis of  By: Ermalene Searing MD, Amy     SKIN LESION, BENIGN 02/15/2008   Qualifier: Diagnosis of  By: Ermalene Searing MD, Amy     SYNCOPE, HX OF 11/30/2007   Qualifier: Diagnosis of  By: Ermalene Searing MD, Amy     Tick bite 04/14/2016    No Known Allergies   OBJECTIVE General Patient is awake, alert, and oriented x 3 and in no acute distress. Derm Skin is dry and supple bilateral. Negative open lesions or macerations. Remaining integument unremarkable. Nails are tender, long, thickened and dystrophic with subungual debris, consistent with onychomycosis, 1-5 bilateral. No signs of infection noted. Vasc  DP and PT pedal pulses palpable bilaterally. Temperature gradient within normal limits.  Neuro Epicritic and protective  threshold sensation grossly intact bilaterally.  Musculoskeletal Exam No symptomatic pedal deformities noted bilateral. Muscular strength within normal limits.  ASSESSMENT 1.  Pain due to onychomycosis of toenails both  PLAN OF CARE 1. Patient evaluated today.  2. Instructed to maintain good pedal hygiene and foot care.  3. Mechanical debridement of nails 1-5 bilaterally performed using a nail nipper. Filed with dremel without incident.  4. Return to clinic in 3 mos. if there is no alleviation of symptoms especially to the bilateral great toenails we did discuss total permanent nail avulsion.  We will consider this next visit if there is no relief   Felecia Shelling, DPM [facility]  Dr. Felecia Shelling, DPM    [address] [place], [place] [address]                Office [phone]  Fax [phone]

## 2023-06-21 ENCOUNTER — Ambulatory Visit: Payer: Medicare Other | Admitting: Podiatry

## 2023-06-21 DIAGNOSIS — L600 Ingrowing nail: Secondary | ICD-10-CM | POA: Diagnosis not present

## 2023-06-21 NOTE — Progress Notes (Signed)
   Chief Complaint  Patient presents with   Nail Problem    Right hallux nail causing discomfort     Subjective: Patient presents today for evaluation of pain to the medial border of the right great toe. Patient is concerned for possible ingrown nail.  It is very sensitive to touch.  Patient presents today for further treatment and evaluation.  Past Medical History:  Diagnosis Date   Actinic keratosis 11/30/2007   Qualifier: Diagnosis of  By: Ermalene Searing MD, Amy     ALLERGIC RHINITIS 11/30/2007   Qualifier: Diagnosis of  By: Ermalene Searing MD, Amy     Arthritis    Burning chest pain 12/02/2011   Chest pain 11/26/2011   Esophageal stricture    EGD 11/07.  stricture dilated.  hiatal hernia, gastritis   FATIGUE, CHRONIC 09/10/2008   Qualifier: Diagnosis of  By: Ermalene Searing MD, Amy     GERD 11/30/2007   Qualifier: Diagnosis of  By: Ermalene Searing MD, Amy     Hyperlipidemia 06/13/2013   Murmur, cardiac 11/04/2012   NEOPLASM, SKIN, UNCERTAIN BEHAVIOR 02/15/2008   Qualifier: Diagnosis of  By: Ermalene Searing MD, Amy     OBESITY 11/30/2007   Qualifier: Diagnosis of  By: Ermalene Searing MD, Amy     Prediabetes 11/27/2011   ROTATOR CUFF SYNDROME 11/30/2007   Qualifier: Diagnosis of  By: Ermalene Searing MD, Amy     SKIN LESION, BENIGN 02/15/2008   Qualifier: Diagnosis of  By: Ermalene Searing MD, Amy     SYNCOPE, HX OF 11/30/2007   Qualifier: Diagnosis of  By: Ermalene Searing MD, Amy     Tick bite 04/14/2016    Past Surgical History:  Procedure Laterality Date   SKIN CANCER EXCISION      No Known Allergies  Objective:  General: Well developed, nourished, in no acute distress, alert and oriented x3   Dermatology: Skin is warm, dry and supple bilateral.  Medial border right great toe is tender with evidence of an ingrowing nail. Pain on palpation noted to the border of the nail fold. The remaining nails appear unremarkable at this time.   Vascular: DP and PT pulses palpable.  No clinical evidence of vascular compromise  Neruologic: Grossly intact via  light touch bilateral.  Musculoskeletal: No pedal deformity noted  Assesement: #1 Paronychia with ingrowing nail medial border right great  Plan of Care:  -Patient evaluated.  -Discussed treatment alternatives and plan of care. Explained nail avulsion procedure and post procedure course to patient. -Patient opted for permanent partial nail avulsion of the ingrown portion of the nail.  -Prior to procedure, local anesthesia infiltration utilized using 3 ml of a 50:50 mixture of 2% plain lidocaine and 0.5% plain marcaine in a normal hallux block fashion and a betadine prep performed.  -Partial permanent nail avulsion with chemical matrixectomy performed using 3x30sec applications of phenol followed by alcohol flush.  -Light dressing applied.  Post care instructions provided -Return to clinic 3 weeks  Felecia Shelling, DPM [facility]  Dr. Felecia Shelling, DPM    [address] [place], [place] [address]                Office [phone]  Fax [phone]

## 2023-07-12 ENCOUNTER — Encounter: Payer: Self-pay | Admitting: Podiatry

## 2023-07-12 ENCOUNTER — Ambulatory Visit (INDEPENDENT_AMBULATORY_CARE_PROVIDER_SITE_OTHER): Payer: Medicare Other | Admitting: Podiatry

## 2023-07-12 DIAGNOSIS — L600 Ingrowing nail: Secondary | ICD-10-CM | POA: Diagnosis not present

## 2023-07-12 NOTE — Progress Notes (Signed)
   Chief Complaint  Patient presents with   Ingrown Toenail    Nail check - hallux right   "It seems to be healing up just fine"    Subjective: 77 y.o. male presents today status post permanent nail avulsion procedure of the medial border of the right great toe that was performed on 06/21/2023.  Patient doing well.  He soak his foot and applied antibiotic ointment as instructed.   Past Medical History:  Diagnosis Date   Actinic keratosis 11/30/2007   Qualifier: Diagnosis of  By: Ermalene Searing MD, Amy     ALLERGIC RHINITIS 11/30/2007   Qualifier: Diagnosis of  By: Ermalene Searing MD, Amy     Arthritis    Burning chest pain 12/02/2011   Chest pain 11/26/2011   Esophageal stricture    EGD 11/07.  stricture dilated.  hiatal hernia, gastritis   FATIGUE, CHRONIC 09/10/2008   Qualifier: Diagnosis of  By: Ermalene Searing MD, Amy     GERD 11/30/2007   Qualifier: Diagnosis of  By: Ermalene Searing MD, Amy     Hyperlipidemia 06/13/2013   Murmur, cardiac 11/04/2012   NEOPLASM, SKIN, UNCERTAIN BEHAVIOR 02/15/2008   Qualifier: Diagnosis of  By: Ermalene Searing MD, Amy     OBESITY 11/30/2007   Qualifier: Diagnosis of  By: Ermalene Searing MD, Amy     Prediabetes 11/27/2011   ROTATOR CUFF SYNDROME 11/30/2007   Qualifier: Diagnosis of  By: Ermalene Searing MD, Amy     SKIN LESION, BENIGN 02/15/2008   Qualifier: Diagnosis of  By: Ermalene Searing MD, Amy     SYNCOPE, HX OF 11/30/2007   Qualifier: Diagnosis of  By: Ermalene Searing MD, Amy     Tick bite 04/14/2016    Objective: Neurovascular status intact.  Skin is warm, dry and supple. Nail and respective nail fold appears to be healing appropriately.   Assessment: #1 s/p partial permanent nail matrixectomy medial border right great toe   Plan of care: #1 patient was evaluated  #2 light debridement of the periungual debris was performed to the border of the respective toe and nail plate using a tissue nipper. #3 patient is to return to clinic 3 months for routine footcare   Felecia Shelling, DPM [facility]  Dr. Felecia Shelling, DPM    [address] [place], [place] [address]                Office [phone]  Fax [phone]

## 2023-08-06 DIAGNOSIS — R0781 Pleurodynia: Secondary | ICD-10-CM | POA: Diagnosis not present

## 2023-08-23 ENCOUNTER — Ambulatory Visit (INDEPENDENT_AMBULATORY_CARE_PROVIDER_SITE_OTHER): Payer: Medicare Other | Admitting: Podiatry

## 2023-08-23 ENCOUNTER — Encounter: Payer: Self-pay | Admitting: Podiatry

## 2023-08-23 DIAGNOSIS — M79674 Pain in right toe(s): Secondary | ICD-10-CM | POA: Diagnosis not present

## 2023-08-23 DIAGNOSIS — B351 Tinea unguium: Secondary | ICD-10-CM

## 2023-08-23 DIAGNOSIS — M79675 Pain in left toe(s): Secondary | ICD-10-CM | POA: Diagnosis not present

## 2023-08-23 NOTE — Progress Notes (Signed)
   Chief Complaint  Patient presents with   Debridement    Trim toenails    SUBJECTIVE Patient presents to office today complaining of elongated, thickened nails that cause pain while ambulating in shoes.  Patient is unable to trim their own nails. Patient is here for further evaluation and treatment.  Past Medical History:  Diagnosis Date   Actinic keratosis 11/30/2007   Qualifier: Diagnosis of  By: Ermalene Searing MD, Amy     ALLERGIC RHINITIS 11/30/2007   Qualifier: Diagnosis of  By: Ermalene Searing MD, Amy     Arthritis    Burning chest pain 12/02/2011   Chest pain 11/26/2011   Esophageal stricture    EGD 11/07.  stricture dilated.  hiatal hernia, gastritis   FATIGUE, CHRONIC 09/10/2008   Qualifier: Diagnosis of  By: Ermalene Searing MD, Amy     GERD 11/30/2007   Qualifier: Diagnosis of  By: Ermalene Searing MD, Amy     Hyperlipidemia 06/13/2013   Murmur, cardiac 11/04/2012   NEOPLASM, SKIN, UNCERTAIN BEHAVIOR 02/15/2008   Qualifier: Diagnosis of  By: Ermalene Searing MD, Amy     OBESITY 11/30/2007   Qualifier: Diagnosis of  By: Ermalene Searing MD, Amy     Prediabetes 11/27/2011   ROTATOR CUFF SYNDROME 11/30/2007   Qualifier: Diagnosis of  By: Ermalene Searing MD, Amy     SKIN LESION, BENIGN 02/15/2008   Qualifier: Diagnosis of  By: Ermalene Searing MD, Amy     SYNCOPE, HX OF 11/30/2007   Qualifier: Diagnosis of  By: Ermalene Searing MD, Amy     Tick bite 04/14/2016    No Known Allergies   OBJECTIVE General Patient is awake, alert, and oriented x 3 and in no acute distress. Derm Skin is dry and supple bilateral. Negative open lesions or macerations. Remaining integument unremarkable. Nails are tender, long, thickened and dystrophic with subungual debris, consistent with onychomycosis, 1-5 bilateral. No signs of infection noted. Vasc  DP and PT pedal pulses palpable bilaterally. Temperature gradient within normal limits.  Neuro Epicritic and protective threshold sensation grossly intact bilaterally.  Musculoskeletal Exam No symptomatic pedal deformities  noted bilateral. Muscular strength within normal limits.  ASSESSMENT 1.  Pain due to onychomycosis of toenails both  PLAN OF CARE 1. Patient evaluated today.  2. Instructed to maintain good pedal hygiene and foot care.  3. Mechanical debridement of nails 1-5 bilaterally performed using a nail nipper. Filed with dremel without incident.  4. Return to clinic in 3 mos.    Felecia Shelling, DPM [facility]  Dr. Felecia Shelling, DPM    [address] [place], [place] [address]                Office [phone]  Fax [phone]

## 2023-09-13 DIAGNOSIS — R972 Elevated prostate specific antigen [PSA]: Secondary | ICD-10-CM | POA: Diagnosis not present

## 2023-09-23 DIAGNOSIS — R972 Elevated prostate specific antigen [PSA]: Secondary | ICD-10-CM | POA: Diagnosis not present

## 2023-09-23 DIAGNOSIS — R351 Nocturia: Secondary | ICD-10-CM | POA: Diagnosis not present

## 2023-09-23 DIAGNOSIS — N401 Enlarged prostate with lower urinary tract symptoms: Secondary | ICD-10-CM | POA: Diagnosis not present

## 2023-10-12 DIAGNOSIS — D225 Melanocytic nevi of trunk: Secondary | ICD-10-CM | POA: Diagnosis not present

## 2023-10-12 DIAGNOSIS — L570 Actinic keratosis: Secondary | ICD-10-CM | POA: Diagnosis not present

## 2023-10-12 DIAGNOSIS — L814 Other melanin hyperpigmentation: Secondary | ICD-10-CM | POA: Diagnosis not present

## 2023-10-12 DIAGNOSIS — Z85828 Personal history of other malignant neoplasm of skin: Secondary | ICD-10-CM | POA: Diagnosis not present

## 2023-10-12 DIAGNOSIS — Z08 Encounter for follow-up examination after completed treatment for malignant neoplasm: Secondary | ICD-10-CM | POA: Diagnosis not present

## 2023-10-12 DIAGNOSIS — L821 Other seborrheic keratosis: Secondary | ICD-10-CM | POA: Diagnosis not present

## 2023-11-29 ENCOUNTER — Ambulatory Visit: Payer: Medicare Other | Admitting: Podiatry

## 2023-12-08 ENCOUNTER — Ambulatory Visit: Payer: Medicare Other | Admitting: Podiatry

## 2023-12-29 ENCOUNTER — Ambulatory Visit: Payer: Medicare Other | Admitting: Podiatry

## 2024-01-17 ENCOUNTER — Ambulatory Visit (INDEPENDENT_AMBULATORY_CARE_PROVIDER_SITE_OTHER): Payer: Medicare Other | Admitting: Podiatry

## 2024-01-17 ENCOUNTER — Encounter: Payer: Self-pay | Admitting: Podiatry

## 2024-01-17 DIAGNOSIS — M79675 Pain in left toe(s): Secondary | ICD-10-CM | POA: Diagnosis not present

## 2024-01-17 DIAGNOSIS — B351 Tinea unguium: Secondary | ICD-10-CM

## 2024-01-17 DIAGNOSIS — M79674 Pain in right toe(s): Secondary | ICD-10-CM

## 2024-01-17 NOTE — Progress Notes (Signed)
   Chief Complaint  Patient presents with   Debridement    Trim toenails    SUBJECTIVE Patient presents to office today complaining of elongated, thickened nails that cause pain while ambulating in shoes.  Patient is unable to trim their own nails. Patient is here for further evaluation and treatment.  Past Medical History:  Diagnosis Date   Actinic keratosis 11/30/2007   Qualifier: Diagnosis of  By: Ermalene Searing MD, Amy     ALLERGIC RHINITIS 11/30/2007   Qualifier: Diagnosis of  By: Ermalene Searing MD, Amy     Arthritis    Burning chest pain 12/02/2011   Chest pain 11/26/2011   Esophageal stricture    EGD 11/07.  stricture dilated.  hiatal hernia, gastritis   FATIGUE, CHRONIC 09/10/2008   Qualifier: Diagnosis of  By: Ermalene Searing MD, Amy     GERD 11/30/2007   Qualifier: Diagnosis of  By: Ermalene Searing MD, Amy     Hyperlipidemia 06/13/2013   Murmur, cardiac 11/04/2012   NEOPLASM, SKIN, UNCERTAIN BEHAVIOR 02/15/2008   Qualifier: Diagnosis of  By: Ermalene Searing MD, Amy     OBESITY 11/30/2007   Qualifier: Diagnosis of  By: Ermalene Searing MD, Amy     Prediabetes 11/27/2011   ROTATOR CUFF SYNDROME 11/30/2007   Qualifier: Diagnosis of  By: Ermalene Searing MD, Amy     SKIN LESION, BENIGN 02/15/2008   Qualifier: Diagnosis of  By: Ermalene Searing MD, Amy     SYNCOPE, HX OF 11/30/2007   Qualifier: Diagnosis of  By: Ermalene Searing MD, Amy     Tick bite 04/14/2016    No Known Allergies   OBJECTIVE General Patient is awake, alert, and oriented x 3 and in no acute distress. Derm Skin is dry and supple bilateral. Negative open lesions or macerations. Remaining integument unremarkable. Nails are tender, long, thickened and dystrophic with subungual debris, consistent with onychomycosis, 1-5 bilateral. No signs of infection noted. Vasc  DP and PT pedal pulses palpable bilaterally. Temperature gradient within normal limits.  Neuro Epicritic and protective threshold sensation grossly intact bilaterally.  Musculoskeletal Exam No symptomatic pedal deformities  noted bilateral. Muscular strength within normal limits.  ASSESSMENT 1.  Pain due to onychomycosis of toenails both  PLAN OF CARE 1. Patient evaluated today.  2. Instructed to maintain good pedal hygiene and foot care.  3. Mechanical debridement of nails 1-5 bilaterally performed using a nail nipper. Filed with dremel without incident.  4. Return to clinic in 3 mos.    Felecia Shelling, DPM [facility]  Dr. Felecia Shelling, DPM    [address] [place], [place] [address]                Office [phone]  Fax [phone]

## 2024-01-25 DIAGNOSIS — M5412 Radiculopathy, cervical region: Secondary | ICD-10-CM | POA: Diagnosis not present

## 2024-03-13 DIAGNOSIS — R011 Cardiac murmur, unspecified: Secondary | ICD-10-CM | POA: Diagnosis not present

## 2024-03-13 DIAGNOSIS — R0789 Other chest pain: Secondary | ICD-10-CM | POA: Diagnosis not present

## 2024-03-22 DIAGNOSIS — L570 Actinic keratosis: Secondary | ICD-10-CM | POA: Diagnosis not present

## 2024-03-27 DIAGNOSIS — M4802 Spinal stenosis, cervical region: Secondary | ICD-10-CM | POA: Diagnosis not present

## 2024-03-27 DIAGNOSIS — Z1331 Encounter for screening for depression: Secondary | ICD-10-CM | POA: Diagnosis not present

## 2024-03-27 DIAGNOSIS — R030 Elevated blood-pressure reading, without diagnosis of hypertension: Secondary | ICD-10-CM | POA: Diagnosis not present

## 2024-03-27 DIAGNOSIS — K219 Gastro-esophageal reflux disease without esophagitis: Secondary | ICD-10-CM | POA: Diagnosis not present

## 2024-03-27 DIAGNOSIS — E7800 Pure hypercholesterolemia, unspecified: Secondary | ICD-10-CM | POA: Diagnosis not present

## 2024-03-27 DIAGNOSIS — Z23 Encounter for immunization: Secondary | ICD-10-CM | POA: Diagnosis not present

## 2024-03-27 DIAGNOSIS — Z9181 History of falling: Secondary | ICD-10-CM | POA: Diagnosis not present

## 2024-03-27 DIAGNOSIS — Z139 Encounter for screening, unspecified: Secondary | ICD-10-CM | POA: Diagnosis not present

## 2024-03-27 DIAGNOSIS — Z79899 Other long term (current) drug therapy: Secondary | ICD-10-CM | POA: Diagnosis not present

## 2024-03-27 DIAGNOSIS — Z131 Encounter for screening for diabetes mellitus: Secondary | ICD-10-CM | POA: Diagnosis not present

## 2024-04-12 DIAGNOSIS — Z08 Encounter for follow-up examination after completed treatment for malignant neoplasm: Secondary | ICD-10-CM | POA: Diagnosis not present

## 2024-04-12 DIAGNOSIS — Z85828 Personal history of other malignant neoplasm of skin: Secondary | ICD-10-CM | POA: Diagnosis not present

## 2024-04-12 DIAGNOSIS — D225 Melanocytic nevi of trunk: Secondary | ICD-10-CM | POA: Diagnosis not present

## 2024-04-12 DIAGNOSIS — L814 Other melanin hyperpigmentation: Secondary | ICD-10-CM | POA: Diagnosis not present

## 2024-04-12 DIAGNOSIS — M1712 Unilateral primary osteoarthritis, left knee: Secondary | ICD-10-CM | POA: Diagnosis not present

## 2024-04-12 DIAGNOSIS — L821 Other seborrheic keratosis: Secondary | ICD-10-CM | POA: Diagnosis not present

## 2024-04-17 DIAGNOSIS — M1712 Unilateral primary osteoarthritis, left knee: Secondary | ICD-10-CM | POA: Diagnosis not present

## 2024-04-17 DIAGNOSIS — M1711 Unilateral primary osteoarthritis, right knee: Secondary | ICD-10-CM | POA: Diagnosis not present

## 2024-04-17 DIAGNOSIS — M170 Bilateral primary osteoarthritis of knee: Secondary | ICD-10-CM | POA: Diagnosis not present

## 2024-05-30 DIAGNOSIS — M170 Bilateral primary osteoarthritis of knee: Secondary | ICD-10-CM | POA: Diagnosis not present

## 2024-06-09 DIAGNOSIS — H6123 Impacted cerumen, bilateral: Secondary | ICD-10-CM | POA: Diagnosis not present

## 2024-06-09 DIAGNOSIS — H6122 Impacted cerumen, left ear: Secondary | ICD-10-CM | POA: Diagnosis not present

## 2024-06-09 DIAGNOSIS — H6121 Impacted cerumen, right ear: Secondary | ICD-10-CM | POA: Diagnosis not present

## 2024-07-11 DIAGNOSIS — M5412 Radiculopathy, cervical region: Secondary | ICD-10-CM | POA: Diagnosis not present

## 2024-07-27 DIAGNOSIS — M1712 Unilateral primary osteoarthritis, left knee: Secondary | ICD-10-CM | POA: Diagnosis not present

## 2024-08-03 DIAGNOSIS — M1712 Unilateral primary osteoarthritis, left knee: Secondary | ICD-10-CM | POA: Diagnosis not present

## 2024-08-10 DIAGNOSIS — M25662 Stiffness of left knee, not elsewhere classified: Secondary | ICD-10-CM | POA: Diagnosis not present

## 2024-08-10 DIAGNOSIS — M1712 Unilateral primary osteoarthritis, left knee: Secondary | ICD-10-CM | POA: Diagnosis not present

## 2024-08-23 DIAGNOSIS — G43109 Migraine with aura, not intractable, without status migrainosus: Secondary | ICD-10-CM | POA: Diagnosis not present

## 2024-08-31 DIAGNOSIS — M1711 Unilateral primary osteoarthritis, right knee: Secondary | ICD-10-CM | POA: Diagnosis not present

## 2024-08-31 DIAGNOSIS — M25562 Pain in left knee: Secondary | ICD-10-CM | POA: Diagnosis not present

## 2024-09-07 DIAGNOSIS — M25562 Pain in left knee: Secondary | ICD-10-CM | POA: Diagnosis not present

## 2024-09-07 DIAGNOSIS — M1711 Unilateral primary osteoarthritis, right knee: Secondary | ICD-10-CM | POA: Diagnosis not present

## 2024-09-11 DIAGNOSIS — R972 Elevated prostate specific antigen [PSA]: Secondary | ICD-10-CM | POA: Diagnosis not present

## 2024-09-14 DIAGNOSIS — M25562 Pain in left knee: Secondary | ICD-10-CM | POA: Diagnosis not present

## 2024-09-14 DIAGNOSIS — M1711 Unilateral primary osteoarthritis, right knee: Secondary | ICD-10-CM | POA: Diagnosis not present

## 2024-09-24 DIAGNOSIS — J189 Pneumonia, unspecified organism: Secondary | ICD-10-CM | POA: Diagnosis not present

## 2024-09-24 DIAGNOSIS — J029 Acute pharyngitis, unspecified: Secondary | ICD-10-CM | POA: Diagnosis not present

## 2024-10-03 DIAGNOSIS — N401 Enlarged prostate with lower urinary tract symptoms: Secondary | ICD-10-CM | POA: Diagnosis not present

## 2024-10-03 DIAGNOSIS — R972 Elevated prostate specific antigen [PSA]: Secondary | ICD-10-CM | POA: Diagnosis not present

## 2024-10-03 DIAGNOSIS — R351 Nocturia: Secondary | ICD-10-CM | POA: Diagnosis not present

## 2024-10-03 DIAGNOSIS — R21 Rash and other nonspecific skin eruption: Secondary | ICD-10-CM | POA: Diagnosis not present

## 2024-10-16 DIAGNOSIS — Z08 Encounter for follow-up examination after completed treatment for malignant neoplasm: Secondary | ICD-10-CM | POA: Diagnosis not present

## 2024-10-16 DIAGNOSIS — L821 Other seborrheic keratosis: Secondary | ICD-10-CM | POA: Diagnosis not present

## 2024-10-16 DIAGNOSIS — L814 Other melanin hyperpigmentation: Secondary | ICD-10-CM | POA: Diagnosis not present

## 2024-10-16 DIAGNOSIS — D225 Melanocytic nevi of trunk: Secondary | ICD-10-CM | POA: Diagnosis not present

## 2024-10-16 DIAGNOSIS — Z85828 Personal history of other malignant neoplasm of skin: Secondary | ICD-10-CM | POA: Diagnosis not present

## 2024-10-20 DIAGNOSIS — R21 Rash and other nonspecific skin eruption: Secondary | ICD-10-CM | POA: Diagnosis not present

## 2024-10-20 DIAGNOSIS — Z23 Encounter for immunization: Secondary | ICD-10-CM | POA: Diagnosis not present

## 2024-10-20 DIAGNOSIS — Z91018 Allergy to other foods: Secondary | ICD-10-CM | POA: Diagnosis not present

## 2024-10-25 DIAGNOSIS — L720 Epidermal cyst: Secondary | ICD-10-CM | POA: Diagnosis not present

## 2024-11-03 ENCOUNTER — Emergency Department (HOSPITAL_BASED_OUTPATIENT_CLINIC_OR_DEPARTMENT_OTHER)

## 2024-11-03 ENCOUNTER — Inpatient Hospital Stay (HOSPITAL_BASED_OUTPATIENT_CLINIC_OR_DEPARTMENT_OTHER)
Admission: EM | Admit: 2024-11-03 | Discharge: 2024-11-05 | DRG: 063 | Disposition: A | Discharge status: Home / Self Care | Attending: Hospitalist | Admitting: Hospitalist

## 2024-11-03 ENCOUNTER — Other Ambulatory Visit: Payer: Self-pay

## 2024-11-03 DIAGNOSIS — R471 Dysarthria and anarthria: Secondary | ICD-10-CM | POA: Diagnosis present

## 2024-11-03 DIAGNOSIS — E785 Hyperlipidemia, unspecified: Secondary | ICD-10-CM | POA: Diagnosis present

## 2024-11-03 DIAGNOSIS — H532 Diplopia: Secondary | ICD-10-CM | POA: Diagnosis present

## 2024-11-03 DIAGNOSIS — I10 Essential (primary) hypertension: Secondary | ICD-10-CM | POA: Diagnosis present

## 2024-11-03 DIAGNOSIS — I63541 Cerebral infarction due to unspecified occlusion or stenosis of right cerebellar artery: Secondary | ICD-10-CM | POA: Diagnosis present

## 2024-11-03 DIAGNOSIS — Z8249 Family history of ischemic heart disease and other diseases of the circulatory system: Secondary | ICD-10-CM

## 2024-11-03 DIAGNOSIS — I639 Cerebral infarction, unspecified: Secondary | ICD-10-CM | POA: Diagnosis not present

## 2024-11-03 DIAGNOSIS — R29704 NIHSS score 4: Secondary | ICD-10-CM | POA: Diagnosis present

## 2024-11-03 DIAGNOSIS — R29702 NIHSS score 2: Secondary | ICD-10-CM | POA: Diagnosis not present

## 2024-11-03 DIAGNOSIS — R29810 Facial weakness: Secondary | ICD-10-CM | POA: Diagnosis present

## 2024-11-03 DIAGNOSIS — Z79899 Other long term (current) drug therapy: Secondary | ICD-10-CM | POA: Diagnosis not present

## 2024-11-03 DIAGNOSIS — Z6830 Body mass index (BMI) 30.0-30.9, adult: Secondary | ICD-10-CM

## 2024-11-03 DIAGNOSIS — G510 Bell's palsy: Secondary | ICD-10-CM | POA: Diagnosis present

## 2024-11-03 DIAGNOSIS — Z85828 Personal history of other malignant neoplasm of skin: Secondary | ICD-10-CM

## 2024-11-03 DIAGNOSIS — R5383 Other fatigue: Secondary | ICD-10-CM | POA: Diagnosis present

## 2024-11-03 DIAGNOSIS — L570 Actinic keratosis: Secondary | ICD-10-CM | POA: Diagnosis present

## 2024-11-03 DIAGNOSIS — R29701 NIHSS score 1: Secondary | ICD-10-CM | POA: Diagnosis not present

## 2024-11-03 DIAGNOSIS — R7303 Prediabetes: Secondary | ICD-10-CM | POA: Diagnosis present

## 2024-11-03 DIAGNOSIS — Z1152 Encounter for screening for COVID-19: Secondary | ICD-10-CM

## 2024-11-03 DIAGNOSIS — Z823 Family history of stroke: Secondary | ICD-10-CM

## 2024-11-03 DIAGNOSIS — I63441 Cerebral infarction due to embolism of right cerebellar artery: Secondary | ICD-10-CM | POA: Diagnosis not present

## 2024-11-03 DIAGNOSIS — Z7982 Long term (current) use of aspirin: Secondary | ICD-10-CM | POA: Diagnosis not present

## 2024-11-03 DIAGNOSIS — E669 Obesity, unspecified: Secondary | ICD-10-CM | POA: Diagnosis present

## 2024-11-03 DIAGNOSIS — K219 Gastro-esophageal reflux disease without esophagitis: Secondary | ICD-10-CM | POA: Diagnosis present

## 2024-11-03 DIAGNOSIS — M1990 Unspecified osteoarthritis, unspecified site: Secondary | ICD-10-CM | POA: Diagnosis present

## 2024-11-03 DIAGNOSIS — I6389 Other cerebral infarction: Secondary | ICD-10-CM | POA: Diagnosis not present

## 2024-11-03 LAB — DIFFERENTIAL
Abs Immature Granulocytes: 0.15 K/uL — ABNORMAL HIGH (ref 0.00–0.07)
Basophils Absolute: 0.1 K/uL (ref 0.0–0.1)
Basophils Relative: 1 %
Eosinophils Absolute: 0.2 K/uL (ref 0.0–0.5)
Eosinophils Relative: 2 %
Immature Granulocytes: 1 %
Lymphocytes Relative: 25 %
Lymphs Abs: 2.7 K/uL (ref 0.7–4.0)
Monocytes Absolute: 1.1 K/uL — ABNORMAL HIGH (ref 0.1–1.0)
Monocytes Relative: 10 %
Neutro Abs: 6.3 K/uL (ref 1.7–7.7)
Neutrophils Relative %: 61 %

## 2024-11-03 LAB — COMPREHENSIVE METABOLIC PANEL WITH GFR
ALT: 16 U/L (ref 0–44)
AST: 19 U/L (ref 15–41)
Albumin: 3.9 g/dL (ref 3.5–5.0)
Alkaline Phosphatase: 57 U/L (ref 38–126)
Anion gap: 12 (ref 5–15)
BUN: 17 mg/dL (ref 8–23)
CO2: 23 mmol/L (ref 22–32)
Calcium: 9.5 mg/dL (ref 8.9–10.3)
Chloride: 104 mmol/L (ref 98–111)
Creatinine, Ser: 0.85 mg/dL (ref 0.61–1.24)
GFR, Estimated: >60 mL/min
Glucose, Bld: 106 mg/dL — ABNORMAL HIGH (ref 70–99)
Potassium: 3.6 mmol/L (ref 3.5–5.1)
Sodium: 139 mmol/L (ref 135–145)
Total Bilirubin: 0.3 mg/dL (ref 0.0–1.2)
Total Protein: 6.7 g/dL (ref 6.5–8.1)

## 2024-11-03 LAB — CBC
HCT: 38.1 % — ABNORMAL LOW (ref 39.0–52.0)
Hemoglobin: 13.1 g/dL (ref 13.0–17.0)
MCH: 31.2 pg (ref 26.0–34.0)
MCHC: 34.4 g/dL (ref 30.0–36.0)
MCV: 90.7 fL (ref 80.0–100.0)
Platelets: 265 K/uL (ref 150–400)
RBC: 4.2 MIL/uL — ABNORMAL LOW (ref 4.22–5.81)
RDW: 13 % (ref 11.5–15.5)
WBC: 10.5 K/uL (ref 4.0–10.5)
nRBC: 0 % (ref 0.0–0.2)

## 2024-11-03 LAB — MRSA NEXT GEN BY PCR, NASAL: MRSA by PCR Next Gen: NOT DETECTED

## 2024-11-03 LAB — PROTIME-INR
INR: 1.1 (ref 0.8–1.2)
Prothrombin Time: 14.4 s (ref 11.4–15.2)

## 2024-11-03 LAB — APTT: aPTT: 28 s (ref 24–36)

## 2024-11-03 LAB — TROPONIN T, HIGH SENSITIVITY: Troponin T High Sensitivity: <15 ng/L (ref 0–19)

## 2024-11-03 LAB — RESP PANEL BY RT-PCR (RSV, FLU A&B, COVID)  RVPGX2
Influenza A by PCR: NEGATIVE
Influenza B by PCR: NEGATIVE
Resp Syncytial Virus by PCR: NEGATIVE
SARS Coronavirus 2 by RT PCR: NEGATIVE

## 2024-11-03 LAB — HEMOGLOBIN A1C
Hgb A1c MFr Bld: 5.6 % (ref 4.8–5.6)
Mean Plasma Glucose: 114.02 mg/dL

## 2024-11-03 LAB — CBG MONITORING, ED: Glucose-Capillary: 144 mg/dL — ABNORMAL HIGH (ref 70–99)

## 2024-11-03 MED ORDER — PROMETHAZINE (PHENERGAN) 6.25MG IN NS 50ML IVPB
6.2500 mg | Freq: Once | INTRAVENOUS | Status: AC
  Administered 2024-11-03: 6.25 mg via INTRAVENOUS
  Filled 2024-11-03: qty 50

## 2024-11-03 MED ORDER — TENECTEPLASE 25 MG IV KIT
PACK | INTRAVENOUS | Status: AC
  Filled 2024-11-03: qty 5

## 2024-11-03 MED ORDER — ACETAMINOPHEN 650 MG RE SUPP: 650.0000 mg | RECTAL | Status: DC | PRN

## 2024-11-03 MED ORDER — ACETAMINOPHEN 160 MG/5ML PO SOLN: 650.0000 mg | ORAL | Status: DC | PRN

## 2024-11-03 MED ORDER — SODIUM CHLORIDE 0.9% FLUSH
3.0000 mL | Freq: Once | INTRAVENOUS | Status: AC
  Administered 2024-11-03: 3 mL via INTRAVENOUS

## 2024-11-03 MED ORDER — FINASTERIDE 5 MG PO TABS
5.0000 mg | ORAL_TABLET | Freq: Every day | ORAL | Status: DC
  Administered 2024-11-04 – 2024-11-05 (×2): 5 mg via ORAL
  Filled 2024-11-03 (×2): qty 1

## 2024-11-03 MED ORDER — ACETAMINOPHEN 325 MG PO TABS
650.0000 mg | ORAL_TABLET | ORAL | Status: DC | PRN
  Administered 2024-11-05: 650 mg via ORAL
  Filled 2024-11-03: qty 2

## 2024-11-03 MED ORDER — IOHEXOL 350 MG/ML SOLN
75.0000 mL | Freq: Once | INTRAVENOUS | Status: AC | PRN
  Administered 2024-11-03: 75 mL via INTRAVENOUS

## 2024-11-03 MED ORDER — ONDANSETRON HCL 4 MG/2ML IJ SOLN
4.0000 mg | Freq: Once | INTRAMUSCULAR | Status: AC
  Administered 2024-11-03: 4 mg via INTRAVENOUS
  Filled 2024-11-03: qty 2

## 2024-11-03 MED ORDER — STROKE: EARLY STAGES OF RECOVERY BOOK
Freq: Once | Status: AC
  Administered 2024-11-03: 1
  Filled 2024-11-03: qty 1

## 2024-11-03 MED ORDER — LORAZEPAM 2 MG/ML IJ SOLN
0.5000 mg | Freq: Once | INTRAMUSCULAR | Status: AC
  Administered 2024-11-03: 0.5 mg via INTRAVENOUS
  Filled 2024-11-03: qty 1

## 2024-11-03 MED ORDER — GABAPENTIN 300 MG PO CAPS
300.0000 mg | ORAL_CAPSULE | Freq: Every day | ORAL | Status: DC
  Administered 2024-11-03 – 2024-11-04 (×2): 300 mg via ORAL
  Filled 2024-11-03 (×2): qty 1

## 2024-11-03 MED ORDER — TENECTEPLASE 25 MG IV KIT
0.2500 mg/kg | PACK | Freq: Once | INTRAVENOUS | Status: AC
  Administered 2024-11-03: 21 mg via INTRAVENOUS

## 2024-11-03 MED ORDER — CHLORHEXIDINE GLUCONATE CLOTH 2 % EX PADS
6.0000 | MEDICATED_PAD | Freq: Every day | CUTANEOUS | Status: DC
  Administered 2024-11-04 – 2024-11-05 (×2): 6 via TOPICAL

## 2024-11-03 MED ORDER — SODIUM CHLORIDE 0.9 % IV SOLN: INTRAVENOUS | Status: DC

## 2024-11-03 MED ORDER — SODIUM CHLORIDE 0.9 % IV BOLUS
1000.0000 mL | Freq: Once | INTRAVENOUS | Status: AC
  Administered 2024-11-03: 1000 mL via INTRAVENOUS

## 2024-11-03 MED ORDER — ONDANSETRON HCL 4 MG/2ML IJ SOLN
4.0000 mg | Freq: Once | INTRAMUSCULAR | Status: AC
  Administered 2024-11-03: 4 mg via INTRAVENOUS

## 2024-11-03 MED ORDER — PROMETHAZINE HCL 25 MG/ML IJ SOLN
INTRAMUSCULAR | Status: AC
  Filled 2024-11-03: qty 1

## 2024-11-03 MED ORDER — ONDANSETRON HCL 4 MG/2ML IJ SOLN
INTRAMUSCULAR | Status: AC
  Filled 2024-11-03: qty 2

## 2024-11-03 MED ORDER — PANTOPRAZOLE SODIUM 40 MG IV SOLR
40.0000 mg | Freq: Every day | INTRAVENOUS | Status: DC
  Administered 2024-11-03: 40 mg via INTRAVENOUS
  Filled 2024-11-03: qty 10

## 2024-11-03 MED ORDER — HYDRALAZINE HCL 20 MG/ML IJ SOLN
10.0000 mg | INTRAMUSCULAR | Status: DC | PRN
  Administered 2024-11-03 – 2024-11-05 (×2): 10 mg via INTRAVENOUS
  Filled 2024-11-03 (×2): qty 1

## 2024-11-03 NOTE — ED Notes (Signed)
 Pt taken to CT.

## 2024-11-03 NOTE — ED Notes (Addendum)
 ED Provider, Pamella MD, cleared airway for CT.

## 2024-11-03 NOTE — ED Provider Notes (Signed)
 " [facility] AT [facility] Provider Note   CSN: [id number] Arrival date & time: 11/03/24  1447  An emergency department physician performed an initial assessment on this suspected stroke patient at 52.  Patient presents with: Dizziness   [name] is a 78 y.o. male.  With a history of hyperlipidemia syncope prediabetes who presents the ED for dizziness.  Patient was feeling well earlier this morning but had acute onset dizziness around 1145 at work today.  Began to vomit multiple times while in the emergency department and had a witnessed episode of near syncope in triage.  [name] headaches chest pain shortness of breath focal weakness reported fevers.  Patient does mention now he feels better after vomiting and is experiencing chills currently.  [name] anticoagulation    Dizziness      Prior to Admission medications  Medication Sig Start Date End Date Taking? Authorizing Provider  Apoaequorin (PREVAGEN PO) Take 1 tablet by mouth daily.   Yes [provider]  Ascorbic Acid (VITAMIN C PO) Take 1 tablet by mouth daily.   Yes [provider]  Calcium -Magnesium-Zinc (CAL-MAG-ZINC PO) Take 1 tablet by mouth daily.   Yes [provider]  cholecalciferol (VITAMIN D3) 25 MCG (1000 UNIT) tablet Take 1,000 Units by mouth daily.   Yes [provider]  esomeprazole (NEXIUM) 40 MG capsule Take 40 mg by mouth daily at 12 noon.   Yes [provider]  finasteride  (PROSCAR ) 5 MG tablet Take 5 mg by mouth daily.  12/03/18  Yes [provider]  gabapentin  (NEURONTIN ) 300 MG capsule Take 300 mg by mouth at bedtime.  12/06/18  Yes [provider]  melatonin 3 MG TABS tablet Take 4.5 mg by mouth at bedtime.    Yes [provider]  Multiple Vitamin (MULTIVITAMIN WITH MINERALS) TABS tablet Take 1 tablet by mouth daily.   Yes [provider]  PHOSPHATIDYL CHOLINE PO Take 1 tablet by mouth daily.   Yes  [provider]  vitamin B-12 (CYANOCOBALAMIN ) 1000 MCG tablet Take 1,000 mcg by mouth daily.   Yes [provider]    Allergies: Patient has [name] known allergies.    Review of Systems  Neurological:  Positive for dizziness.    Updated Vital Signs BP (!) 160/76 (BP Location: Right Arm)   Pulse 67   Temp 98.2 F (36.8 C) (Oral)   Resp 18   SpO2 94%   Physical Exam Vitals and nursing note reviewed.  HENT:     Head: Normocephalic and atraumatic.  Eyes:     Pupils: Pupils are equal, round, and reactive to light.  Cardiovascular:     Rate and Rhythm: Normal rate and regular rhythm.  Pulmonary:     Effort: Pulmonary effort is normal.     Breath sounds: Normal breath sounds.  Abdominal:     Palpations: Abdomen is soft.     Tenderness: There is [name] abdominal tenderness.  Skin:    General: Skin is warm and dry.  Neurological:     Mental Status: He is alert.     Comments: [name] focal neurologic deficits Symmetric motor strength bilateral upper and lower extremities Difficulty with completing finger-to-nose in both hands Sensation tact light touch throughout Clear fluent speech Awake alert oriented x 4  Psychiatric:        Mood and Affect: Mood normal.     (all labs ordered are listed, but only abnormal results are displayed) Labs Reviewed  CBC -  Abnormal; Notable for the following components:      Result Value   RBC 4.20 (*)    HCT 38.1 (*)    All other components within normal limits  DIFFERENTIAL - Abnormal; Notable for the following components:   Monocytes Absolute 1.1 (*)    Abs Immature Granulocytes 0.15 (*)    All other components within normal limits  COMPREHENSIVE METABOLIC PANEL WITH GFR - Abnormal; Notable for the following components:   Glucose, Bld 106 (*)    All other components within normal limits  CBG MONITORING, ED - Abnormal; Notable for the following components:   Glucose-Capillary 144 (*)    All other components within normal limits   RESP PANEL BY RT-PCR (RSV, FLU A&B, COVID)  RVPGX2  PROTIME-INR  APTT  ETHANOL  CBG MONITORING, ED  TROPONIN T, HIGH SENSITIVITY  TROPONIN T, HIGH SENSITIVITY    EKG: None  Radiology: CT ANGIO HEAD NECK W WO CM (CODE STROKE) Result Date: 11/03/2024 EXAM: CTA HEAD AND NECK WITH AND WITHOUT 11/03/2024 03:29:57 PM TECHNIQUE: CTA of the head and neck was performed with and without the administration of intravenous contrast. Multiplanar 2D and/or 3D reformatted images are provided for review. Automated exposure control, iterative reconstruction, and/or weight based adjustment of the mA/kV was utilized to reduce the radiation dose to as low as reasonably achievable. Stenosis of the internal carotid arteries measured using NASCET criteria. COMPARISON: None available CLINICAL HISTORY: Neuro deficit, acute, stroke suspected. FINDINGS: CTA NECK: AORTIC ARCH AND ARCH VESSELS: [name] dissection or arterial injury. [name] significant stenosis of the brachiocephalic or subclavian arteries. CERVICAL CAROTID ARTERIES: Punctate focus of calcified plaque at the right carotid bifurcation. [name] dissection, arterial injury, or hemodynamically significant stenosis by NASCET criteria. CERVICAL VERTEBRAL ARTERIES: Mildly dominant right vertebral artery. [name] dissection, arterial injury, or significant stenosis. LUNGS AND MEDIASTINUM: Unremarkable. SOFT TISSUES: [name] acute abnormality. BONES: Moderate multilevel cervical disc and facet degeneration. CTA HEAD: ANTERIOR CIRCULATION: The intracranial internal carotid arteries are widely patent with minimal nonstenotic atherosclerosis. The anterior cerebral arteries and middle cerebral arteries are patent without evidence of a proximal branch occlusion or significant proximal stenosis. [name] aneurysm. POSTERIOR CIRCULATION: The intracranial vertebral arteries are patent to the basilar with mild atherosclerotic irregularity of the right V4 segment but [name] significant stenosis. The basilar artery  is widely patent. Posterior communicating arteries are diminutive or absent. Both posterior cerebral arteries are patent without evidence of a significant proximal stenosis. [name] aneurysm. OTHER: [name] dural venous sinus thrombosis on this non-dedicated study. IMPRESSION: 1. Minimal atherosclerosis without a large vessel occlusion or significant proximal stenosis in the head or neck. 2. These results were communicated to Dr. KYM Ross at 3:45 PM on 11/03/2024 by secure text page via the [facility] messaging system. Electronically signed by: Dasie Hamburg MD 11/03/2024 03:51 PM EST RP Workstation: [id number]   CT HEAD CODE STROKE WO CONTRAST Result Date: 11/03/2024 EXAM: CT HEAD WITHOUT CONTRAST 11/03/2024 03:18:50 PM TECHNIQUE: CT of the head was performed without the administration of intravenous contrast. Automated exposure control, iterative reconstruction, and/or weight based adjustment of the mA/kV was utilized to reduce the radiation dose to as low as reasonably achievable. COMPARISON: CT head without contrast 07/01/2020. CLINICAL HISTORY: Neuro deficit, acute, stroke suspected. Multiple episodes of dizziness and near syncope beginning at 11:45 am. FINDINGS: BRAIN AND VENTRICLES: [name] acute hemorrhage. [name] evidence of acute infarct. A remote lacunar infarct is present in the right caudate head. Cortical sulci and white matter changes are stable. [name] hydrocephalus.  [name] extra-axial collection. [name] mass effect or midline shift. ORBITS: Bilateral lens replacements are noted. The globes and orbits are otherwise within normal limits. SINUSES: [name] acute abnormality. SOFT TISSUES AND SKULL: [name] acute soft tissue abnormality. [name] skull fracture. [place] Stroke Program Early CT Score (ASPECTS) Ganglionic (caudate, IC, lentiform nucleus, insula, M1-M3): 7 Supraganglionic (M4-M6): 3 Total: 10 IMPRESSION: 1. [name] acute intracranial abnormality. 2. Remote lacunar infarct in the right caudate head. 3. ASPECTS: 10. 4. These results were  communicated to Dr. Matthews at 3:26 pm on 11/03/2024 by secure text page via the [facility] messaging system. Electronically signed by: Lonni Necessary MD 11/03/2024 03:28 PM EST RP Workstation: [id number]     .Critical Care  Performed by: Pamella Ozell LABOR, DO Authorized by: Pamella Ozell LABOR, DO   Critical care provider statement:    Critical care time (minutes):  40   Critical care was necessary to treat or prevent imminent or life-threatening deterioration of the following conditions:  CNS failure or compromise   Critical care was time spent personally by me on the following activities:  Development of treatment plan with patient or surrogate, discussions with consultants, evaluation of patient's response to treatment, examination of patient, ordering and review of laboratory studies, ordering and review of radiographic studies, ordering and performing treatments and interventions, pulse oximetry, re-evaluation of patient's condition, review of old charts and obtaining history from patient or surrogate   I assumed direction of critical care for this patient from another provider in my specialty: [name]     Care discussed with: admitting provider      Medications Ordered in the ED  sodium chloride  flush (NS) 0.9 % injection 3 mL (has [name] administration in time range)  ondansetron  (ZOFRAN ) 4 MG/2ML injection (has [name] administration in time range)  tenecteplase  (TNKASE ) 25 MG injection for stroke (has [name] administration in time range)  promethazine  (PHENERGAN ) 25 MG/ML injection (has [name] administration in time range)  iohexol  (OMNIPAQUE ) 350 MG/ML injection 75 mL (75 mLs Intravenous Contrast Given 11/03/24 1530)  ondansetron  (ZOFRAN ) injection 4 mg (4 mg Intravenous Given 11/03/24 1531)  sodium chloride  0.9 % bolus 1,000 mL (1,000 mLs Intravenous New Bag/Given 11/03/24 1540)  ondansetron  (ZOFRAN ) injection 4 mg (4 mg Intravenous Given 11/03/24 1539)  tenecteplase  (TNKASE ) injection for stroke 21 mg (21  mg Intravenous Given 11/03/24 1545)  promethazine  (PHENERGAN ) 6.25 mg/NS 50 mL IVPB (6.25 mg Intravenous New Bag/Given 11/03/24 1558)    Clinical Course as of 11/03/24 1733  Fri Nov 03, 2024  1545 Dr. Matthews has completed her evaluation.  In addition to persistent dizziness patient has exhibited facial asymmetry and dysarthria upon her evaluation.  Patient remains within the time window.  Dr. Matthews had discussed the risks and benefits of TNK with the patient and his family at bedside.  The family understands the risks and benefits of TNK as does the patient and have chosen to proceed with TNK administration.  TNK administered at this time.  Patient will be subsequently transferred to [facility] neuro ICU.  Dr. Michaela will be the accepting neurologist at [facility] and is aware of this plan [MP]  1720 Critical care transport team is transporting patient to [facility] at this time.  He has remained stable and is neurologically intact at this point [MP]    Clinical Course User Index [MP] Pamella Ozell LABOR, DO  Medical Decision Making 78 year old male with history as above presented to the ED for acute onset dizziness.  Near syncope in triage.  Nausea and vomiting in triage.  Code stroke activated.  I examined the patient with Dr. Matthews (neurology).  CT head without evidence of hemorrhage.  We obtained CTA as well Dr. Matthews will review and assess the patient.  Will evaluate for stroke with complete neurologic evaluation.  Also will consider acute onset viral illness such as the flu COVID or one of the other viral GI illnesses currently circulating.  Will obtain laboratory workup.  [name] reported chest pain but given concern for syncope and some risk factors including age will obtain high sensitive troponin EKG laboratory workup viral panel.  Zofran  for nausea and IV fluids for rehydration.  Amount and/or Complexity of Data Reviewed Labs: ordered. Radiology:  ordered.  Risk Prescription drug management. Decision regarding hospitalization.        Final diagnoses:  Acute ischemic stroke [facility])    ED Discharge Orders     None          Pamella Ozell LABOR, DO 11/03/24 1733  "

## 2024-11-03 NOTE — Consult Note (Signed)
 NEUROLOGY TELESTROKE CONSULT NOTE   Date of service: November 03, 2024 Patient Name: [name] MRN:  [id number] DOB:  07-18-1946 Chief Complaint: vertigo Requesting Provider: Pamella Ozell LABOR, DO  Consult Participants: myself Location of the provider: [facility] Location of the patient: [facility]  This consult was provided via telemedicine with 2-way video and audio communication. The patient/family was informed that care would be provided in this way and agreed to receive care in this manner.   History of Present Illness   Neurology paged [phone] Neurology on camera 1517  This is a 78 yo man with hx HL, HTN who presented to [facility] with acute onset vertigo with nausea and vomiting. LKW 1145. Since then he has had increasingly severe bouts of room spinning vertigo with severe nausea and vomiting. Head CT no acute process. NIHSS = 2 for R facial droop and dysarthria. Given vertigo suspicion was high for acute posterior circulation infarct with disabling deficits. Risks, benefits, alternatives to TNK were discussed with patient and his wife at bedside who gave informed consent to proceed. There was some delay in TNK administration 2/2 patient vomiting which prolonged exam and consent process. CTA showed no LVO therefore thrombectomy was not indicated.  LKW: 1145 Modified rankin score: 0-Completely asymptomatic and back to baseline post- stroke IV Thrombolysis: yes EVT: no LVO  NIHSS components Score: Comment  1a Level of Conscious 0[x]  1[]  2[]  3[]      1b LOC Questions 0[x]  1[]  2[]       1c LOC Commands 0[x]  1[]  2[]       2 Best Gaze 0[x]  1[]  2[]       3 Visual 0[x]  1[]  2[]  3[]      4 Facial Palsy 0[]  1[x]  2[]  3[]      5a Motor Arm - left 0[x]  1[]  2[]  3[]  4[]  UN[]    5b Motor Arm - Right 0[x]  1[]  2[]  3[]  4[]  UN[]    6a Motor Leg - Left 0[x]  1[]  2[]  3[]  4[]  UN[]    6b Motor Leg - Right 0[x]  1[]  2[]  3[]  4[]  UN[]    7 Limb Ataxia 0[x]  1[]  2[]  3[]  UN[]     8 Sensory 0[x]  1[]  2[]  UN[]      9 Best Language  0[x]  1[]  2[]  3[]      10 Dysarthria 0[]  1[x]  2[]  UN[]      11 Extinct. and Inattention 0[x]  1[]  2[]       TOTAL:  2     ROS   Comprehensive ROS performed and pertinent positives documented in HPI   Past History   Past Medical History:  Diagnosis Date   Actinic keratosis 11/30/2007   Qualifier: Diagnosis of  By: Avelina MD, Amy     ALLERGIC RHINITIS 11/30/2007   Qualifier: Diagnosis of  By: Avelina MD, Amy     Arthritis    Burning chest pain 12/02/2011   Chest pain 11/26/2011   Esophageal stricture    EGD 11/07.  stricture dilated.  hiatal hernia, gastritis   FATIGUE, CHRONIC 09/10/2008   Qualifier: Diagnosis of  By: Avelina MD, Amy     GERD 11/30/2007   Qualifier: Diagnosis of  By: Avelina MD, Amy     Hyperlipidemia 06/13/2013   Murmur, cardiac 11/04/2012   NEOPLASM, SKIN, UNCERTAIN BEHAVIOR 02/15/2008   Qualifier: Diagnosis of  By: Avelina MD, Amy     OBESITY 11/30/2007   Qualifier: Diagnosis of  By: Avelina MD, Amy     Prediabetes 11/27/2011   ROTATOR CUFF SYNDROME 11/30/2007   Qualifier: Diagnosis of  By: Avelina MD, Amy  SKIN LESION, BENIGN 02/15/2008   Qualifier: Diagnosis of  By: Avelina MD, Amy     SYNCOPE, HX OF 11/30/2007   Qualifier: Diagnosis of  By: Avelina MD, Amy     Tick bite 04/14/2016    Past Surgical History:  Procedure Laterality Date   SKIN CANCER EXCISION      Family History: Family History  Problem Relation Age of Onset   Hypertension Mother    Stroke Father    Colon cancer Neg Hx     Social History  reports that he has never smoked. He has never used smokeless tobacco. He reports that he does not drink alcohol and does not use drugs.  Allergies[1]  Medications  Current Medications[2]  Vitals   Vitals:   11/03/24 1508  BP: (!) 142/87  Pulse: (!) 50  Resp: 18  SpO2: 97%    There is no height or weight on file to calculate BMI.  Physical Exam   Physical Exam Gen: A&O x4, vomiting Resp: normal WOB CV: extremities appear  well-perfused  Neurologic Examination   Neuro: *MS: A&O x4. Follows multi-step commands.  *Speech: mildly dysarthric, no aphasia, able to name and repeat *CN: PERRL 3mm, EOMI, VFF by confrontation, sensation intact, R lower facial droop, hearing intact to voice *Motor:   Normal bulk.  No tremor, rigidity or bradykinesia. No pronator drift. All extremities appear full-strength and symmetric. *Sensory: SILT. Symmetric. No double-simultaneous extinction.  *Coordination:  dysmetria without ataxia on FNF bilat *Reflexes:  UTA 2/2 tele-exam *Gait: deferred  Labs/Imaging/Neurodiagnostic studies   CBC: No results for input(s): WBC, NEUTROABS, HGB, HCT, MCV, PLT in the last 168 hours. Basic Metabolic Panel:  Lab Results  Component Value Date   NA 141 05/06/2023   K 4.9 05/06/2023   CO2 28 05/06/2023   GLUCOSE 95 05/06/2023   BUN 21 05/06/2023   CREATININE 1.00 05/06/2023   CALCIUM  9.5 05/06/2023   GFRNONAA >60 05/06/2023   GFRAA >60 12/15/2018   Lipid Panel:  Lab Results  Component Value Date   LDLCALC 112 (H) 01/09/2019   HgbA1c: No results found for: HGBA1C Urine Drug Screen: No results found for: LABOPIA, COCAINSCRNUR, LABBENZ, AMPHETMU, THCU, LABBARB  Alcohol Level No results found for: ETH INR No results found for: INR APTT No results found for: APTT AED levels: No results found for: PHENYTOIN, ZONISAMIDE, LAMOTRIGINE, LEVETIRACETA  CT Head without contrast(Personally reviewed): No acute process  CT angio Head and Neck with contrast(Personally reviewed): No LVO   ASSESSMENT   This is a 78 yo man with hx HL, HTN who presented to [facility] with acute onset vertigo with nausea and vomiting as well as R facial droop and dysarthria concerning for posterior circulation stroke with disabling deficits. He received TNK. CTA showed no LVO.  RECOMMENDATIONS   - Transfer to 4N neuro ICU at [facility] under accepting MD Dr. Aisha Seals -  patient has bed and [facility] is en route - Keep SBP less than 180/105 for the 1st 24 hours post TNK to reduce the risk of hemorrhagic transformation - Neurochecks and NIHSS documentation per post-TNK protocol - STAT head CT for any change in neurologic exam - MRI brain wo contrast to be performed 24 hrs after administration of TNK to both evaluate for stroke and r/o hemorrhagic conversion - No antiplatelets or anticoagulation including pharmacologic DVT prophylaxis until brain imaging 24 hrs post-TNK confirms no ICH - Check LDL and add statin per guidelines for goal LDL <70 - TTE - SCDs for  DVT prophylaxis - NPO until bedside dysphagia screen passed and documented - PT/OT/SLP - stroke education - outpatient f/u with neurology after discharge - Neurology will continue to follow  Please notify [facility] neurohospitalist upon patient arrival to [facility]  This patient is critically ill and at significant risk of neurological worsening, death and care requires constant monitoring of vital signs, hemodynamics,respiratory and cardiac monitoring, neurological assessment, discussion with family, other specialists and medical decision making of high complexity. I spent 65 minutes of neurocritical care time  in the care of  this patient. This was time spent independent of any time provided by nurse practitioner or PA.  Elida Ross, MD [facility] [phone]  If 7pm- 7am, please page neurology on call as listed in AMION.    [1] No Known Allergies [2]  Current Facility-Administered Medications:    sodium chloride  flush (NS) 0.9 % injection 3 mL, 3 mL, Intravenous, Once, Pamella Ozell LABOR, DO  Current Outpatient Medications:    Calcium -Magnesium-Zinc (CAL-MAG-ZINC PO), Take 1 tablet by mouth daily., Disp: , Rfl:    cholecalciferol (VITAMIN D3) 25 MCG (1000 UNIT) tablet, Take 1,000 Units by mouth daily., Disp: , Rfl:    esomeprazole (NEXIUM) 40 MG capsule, Take 40 mg by mouth daily at 12  noon., Disp: , Rfl:    finasteride  (PROSCAR ) 5 MG tablet, Take 5 mg by mouth daily. , Disp: , Rfl:    gabapentin  (NEURONTIN ) 300 MG capsule, Take 300 mg by mouth at bedtime. , Disp: , Rfl:    melatonin 3 MG TABS tablet, Take 4.5 mg by mouth at bedtime. , Disp: , Rfl:    Multiple Vitamin (MULTIVITAMIN WITH MINERALS) TABS tablet, Take 1 tablet by mouth daily., Disp: , Rfl:    Multiple Vitamins-Minerals (MULTIVITAMIN ADULT) TABS, Take 1 tablet by mouth daily., Disp: , Rfl:    vitamin B-12 (CYANOCOBALAMIN ) 1000 MCG tablet, Take 1,000 mcg by mouth daily., Disp: , Rfl:

## 2024-11-03 NOTE — ED Notes (Signed)
 Called [facility] to transport the patient to [facility] to ED--Dr. Michaela accepting

## 2024-11-03 NOTE — ED Triage Notes (Signed)
 Pt bib family stating pt has had multiple episodes of dizziness and near syncope starting at ~ 1145 today. Pt 2 episodes of emesis in triage

## 2024-11-03 NOTE — ED Notes (Signed)
 TNK given at 1545.

## 2024-11-03 NOTE — H&P (Signed)
 NEUROLOGY H&P NOTE   Date of service: November 03, 2024 Patient Name: [name] MRN:  [id number] DOB:  1946-10-29 Chief Complaint: Dizziness  History of Present Illness  [name] is a 78 y.o. male with hx of hyperlipidemia and hypertension who presented to [facility] today with acute onset vertigo.  On evaluation by Dr. Matthews, he was noted to have some facial weakness as well as dysarthria and this coupled with his vertigo major concern for posterior circulation stroke and therefore she discussed risks, benefits, and alternatives of IV TNK with the patient who agreed with proceeding.   Last known well: 11:45 AM Modified rankin score: 0-Completely asymptomatic and back to baseline post- stroke IV Thrombolysis: Yes  Thrombectomy: No LVO    NIHSS components Score: Comment  1a Level of Conscious 0[]  1[]  2[]  3[]      1b LOC Questions 0[]  1[]  2[]       1c LOC Commands 0[]  1[]  2[]       2 Best Gaze 0[]  1[]  2[]       3 Visual 0[]  1[]  2[]  3[]      4 Facial Palsy 0[]  1[x]  2[]  3[]      5a Motor Arm - left 0[]  1[]  2[]  3[]  4[]  UN[]    5b Motor Arm - Right 0[]  1[]  2[]  3[]  4[]  UN[]    6a Motor Leg - Left 0[]  1[]  2[]  3[]  4[]  UN[]    6b Motor Leg - Right 0[]  1[]  2[]  3[]  4[]  UN[]    7 Limb Ataxia 0[]  1[]  2[]  UN[]      8 Sensory 0[]  1[]  2[]  UN[]      9 Best Language 0[]  1[]  2[]  3[]      10 Dysarthria 0[]  1[]  2[]  UN[]      11 Extinct. and Inattention 0[]  1[]  2[]       TOTAL: 1       Past History   Past Medical History:  Diagnosis Date   Actinic keratosis 11/30/2007   Qualifier: Diagnosis of  By: Avelina MD, Amy     ALLERGIC RHINITIS 11/30/2007   Qualifier: Diagnosis of  By: Avelina MD, Amy     Arthritis    Burning chest pain 12/02/2011   Chest pain 11/26/2011   Esophageal stricture    EGD 11/07.  stricture dilated.  hiatal hernia, gastritis   FATIGUE, CHRONIC 09/10/2008   Qualifier: Diagnosis of  By: Avelina MD, Amy     GERD 11/30/2007   Qualifier: Diagnosis of  By: Avelina MD,  Amy     Hyperlipidemia 06/13/2013   Murmur, cardiac 11/04/2012   NEOPLASM, SKIN, UNCERTAIN BEHAVIOR 02/15/2008   Qualifier: Diagnosis of  By: Avelina MD, Amy     OBESITY 11/30/2007   Qualifier: Diagnosis of  By: Avelina MD, Amy     Prediabetes 11/27/2011   ROTATOR CUFF SYNDROME 11/30/2007   Qualifier: Diagnosis of  By: Avelina MD, Amy     SKIN LESION, BENIGN 02/15/2008   Qualifier: Diagnosis of  By: Avelina MD, Amy     SYNCOPE, HX OF 11/30/2007   Qualifier: Diagnosis of  By: Avelina MD, Amy     Tick bite 04/14/2016   Past Surgical History:  Procedure Laterality Date   SKIN CANCER EXCISION     Family History  Problem Relation Age of Onset   Hypertension Mother    Stroke Father    Colon cancer Neg Hx    Social History   Socioeconomic History   Marital status: Married    Spouse name: Not on file   Number of  children: 2   Years of education: Not on file   Highest education level: Not on file  Occupational History   Occupation: Attorney  Tobacco Use   Smoking status: Never   Smokeless tobacco: Never  Vaping Use   Vaping status: Never Used  Substance and Sexual Activity   Alcohol use: No   Drug use: No   Sexual activity: Not on file  Other Topics Concern   Not on file  Social History Narrative   Regular exercise- walks 3-4 times per week, lifts weights, bicycling 10 miles every other day.   Diet- 3 meals , lots of fluids, stopped Coke, changed to green tea.   Social Drivers of Health   Tobacco Use: Low Risk (01/17/2024)   Patient History    Smoking Tobacco Use: Never    Smokeless Tobacco Use: Never    Passive Exposure: Not on file  Financial Resource Strain: Not on file  Food Insecurity: Not on file  Transportation Needs: Not on file  Physical Activity: Not on file  Stress: Not on file  Social Connections: Not on file  Depression (EYV7-0): Not on file  Alcohol Screen: Not on file  Housing: Not on file  Utilities: Not on file  Health Literacy: Not on file    Allergies[1]  Medications  Current Medications[2]   Vitals   Vitals:   11/03/24 1655 11/03/24 1700 11/03/24 1705 11/03/24 1756  BP: (!) 160/75 (!) 161/79 (!) 160/76 (!) 163/83  Pulse: (!) 57 (!) 57 67   Resp: 18 18 18    Temp:      TempSrc:      SpO2: 94% 95% 94%      There is no height or weight on file to calculate BMI.  Physical Exam   Constitutional: Appears well-developed and well-nourished.  Psych: Affect appropriate to situation.  Eyes: No scleral injection.  HENT: No OP obstruction.  Head: Normocephalic.  Cardiovascular: Normal rate and regular rhythm.  Respiratory: Effort normal, non-labored breathing.  GI: Soft.  No distension. There is no tenderness.  Skin: WDI.   Neurologic Examination    Neuro: Mental Status: Patient is awake, alert, oriented to person, place, month, year, and situation. Patient is able to give a clear and coherent history. No signs of aphasia or neglect Cranial Nerves: II: Visual Fields are full. Pupils are equal, round, and reactive to light.   III,IV, VI: EOMI without ptosis or diploplia.  V: Facial sensation is symmetric to temperature VII: Facial movement is very slightly asymmetric VIII: hearing is intact to voice X: Uvula elevates symmetrically XII: tongue is midline without atrophy or fasciculations.  Motor: Tone is normal. Bulk is normal. 5/5 strength was present in all four extremities.  Sensory: Sensation is symmetric to light touch in the arms and legs. Cerebellar: FNF and HKS are intact bilaterally        Labs   CBC:  Recent Labs  Lab 11/03/24 1548  WBC 10.5  NEUTROABS 6.3  HGB 13.1  HCT 38.1*  MCV 90.7  PLT 265   Basic Metabolic Panel:  Lab Results  Component Value Date   NA 139 11/03/2024   K 3.6 11/03/2024   CO2 23 11/03/2024   GLUCOSE 106 (H) 11/03/2024   BUN 17 11/03/2024   CREATININE 0.85 11/03/2024   CALCIUM 9.5 11/03/2024   GFRNONAA >60 11/03/2024   GFRAA >60 12/15/2018   Lipid  Panel:  Lab Results  Component Value Date   LDLCALC 112 (H) 01/09/2019   HgbA1c:  No results found for: HGBA1C Urine Drug Screen: No results found for: LABOPIA, COCAINSCRNUR, LABBENZ, AMPHETMU, THCU, LABBARB  Alcohol Level No results found for: [facility] INR  Lab Results  Component Value Date   INR 1.1 11/03/2024   APTT  Lab Results  Component Value Date   APTT 28 11/03/2024     CT Head without contrast(Personally reviewed): Negative  CT angio Head and Neck with contrast(Personally reviewed): Negative  Assessment   [name] is a 78 y.o. male with acute vertigo and concern for posterior circulation ischemia.  I agree with Dr. Zollie concern and administration of TNK, but I think it could be possible that this is either peripheral or central.  He will need further workup including MRI.  Primary Diagnosis:  Cerebral infarction, unspecified  Secondary Diagnosis: Prediabetes  Recommendations  - HgbA1c, fasting lipid panel - MRI of the brain without contrast - Frequent neuro checks - Echocardiogram - Prophylactic therapy-none for 24 hours - Risk factor modification - Telemetry monitoring - PT consult, OT consult, Speech consult - Stroke team to follow  ______________________________________________________________________  This patient is critically ill and at significant risk of neurological worsening, death and care requires constant monitoring of vital signs, hemodynamics,respiratory and cardiac monitoring, neurological assessment, discussion with family, other specialists and medical decision making of high complexity. I spent 35 minutes of neurocritical care time  in the care of  this patient. This was time spent independent of any time provided by nurse practitioner or PA.  Aisha Seals, MD [facility]   If 7pm- 7am, please page neurology on call as listed in AMION. 11/03/2024  7:41 PM   Risks, benefits and alternatives of  IVT discussed with patient and/or family and they agreed. CTH personally reviewed prior to TNK administration     [1] No Known Allergies [2]  Current Facility-Administered Medications:    ondansetron (ZOFRAN) 4 MG/2ML injection, , , ,    promethazine (PHENERGAN) 25 MG/ML injection, , , ,    sodium chloride  flush (NS) 0.9 % injection 3 mL, 3 mL, Intravenous, Once, Pamella Sharper A, DO   tenecteplase (TNKASE) 25 MG injection for stroke, , , ,

## 2024-11-03 NOTE — ED Notes (Signed)
 Called [facility] to confirm patient is not going ED to Ed patient is going to [facility] 4N rm# [phone]

## 2024-11-03 NOTE — ED Notes (Signed)
 Tele-Neurologist @ [phone]

## 2024-11-04 ENCOUNTER — Inpatient Hospital Stay (HOSPITAL_COMMUNITY)

## 2024-11-04 DIAGNOSIS — I639 Cerebral infarction, unspecified: Secondary | ICD-10-CM

## 2024-11-04 LAB — LIPID PANEL
Cholesterol: 182 mg/dL (ref 0–200)
HDL: 48 mg/dL
LDL Cholesterol: 121 mg/dL — ABNORMAL HIGH (ref 0–99)
Total CHOL/HDL Ratio: 3.8 ratio
Triglycerides: 69 mg/dL
VLDL: 14 mg/dL (ref 0–40)

## 2024-11-04 MED ORDER — ATORVASTATIN CALCIUM 40 MG PO TABS
40.0000 mg | ORAL_TABLET | Freq: Every day | ORAL | Status: DC
  Administered 2024-11-04 – 2024-11-05 (×2): 40 mg via ORAL
  Filled 2024-11-04 (×2): qty 1

## 2024-11-04 MED ORDER — PANTOPRAZOLE SODIUM 40 MG PO TBEC: 40.0000 mg | DELAYED_RELEASE_TABLET | Freq: Every day | ORAL | Status: DC

## 2024-11-04 MED ORDER — ONDANSETRON HCL 4 MG/2ML IJ SOLN
INTRAMUSCULAR | Status: AC
  Filled 2024-11-04: qty 2

## 2024-11-04 MED ORDER — LOSARTAN POTASSIUM 50 MG PO TABS
25.0000 mg | ORAL_TABLET | Freq: Every day | ORAL | Status: DC
  Administered 2024-11-04 – 2024-11-05 (×2): 25 mg via ORAL
  Filled 2024-11-04 (×2): qty 1

## 2024-11-04 MED ORDER — CLOPIDOGREL BISULFATE 75 MG PO TABS
75.0000 mg | ORAL_TABLET | Freq: Every day | ORAL | Status: DC
  Administered 2024-11-05: 75 mg via ORAL
  Filled 2024-11-04 (×2): qty 1

## 2024-11-04 MED ORDER — ASPIRIN 81 MG PO CHEW
81.0000 mg | CHEWABLE_TABLET | Freq: Every day | ORAL | Status: DC
  Administered 2024-11-05: 81 mg via ORAL
  Filled 2024-11-04 (×2): qty 1

## 2024-11-04 MED ORDER — ONDANSETRON HCL 4 MG/2ML IJ SOLN
4.0000 mg | Freq: Three times a day (TID) | INTRAMUSCULAR | Status: DC | PRN
  Administered 2024-11-04: 4 mg via INTRAVENOUS

## 2024-11-04 MED ORDER — PANTOPRAZOLE SODIUM 40 MG PO TBEC
40.0000 mg | DELAYED_RELEASE_TABLET | Freq: Every day | ORAL | Status: DC
  Administered 2024-11-05: 40 mg via ORAL
  Filled 2024-11-04 (×2): qty 1

## 2024-11-04 NOTE — Evaluation (Signed)
 Speech Language Pathology Evaluation Patient Details Name: [name] MRN: [id number] DOB: [birth year] Today's Date: 11/04/2024 Time: 8452-8397 SLP Time Calculation (min) (ACUTE ONLY): 15 min  Problem List:  Patient Active Problem List   Diagnosis Date Noted   Acute ischemic stroke (HCC) 11/03/2024   Stroke (HCC) 11/03/2024   Hx of adenomatous colonic polyps 03/24/2018   Tick bite 04/14/2016   Hyperlipidemia 06/13/2013   Murmur, cardiac 11/04/2012   Burning chest pain 12/02/2011   High cholesterol 11/27/2011   Prediabetes 11/27/2011   Chest pain 11/26/2011   FATIGUE, CHRONIC 09/10/2008   NEOPLASM, SKIN, UNCERTAIN BEHAVIOR 02/15/2008   Disorder of skin or subcutaneous tissue 02/15/2008   OBESITY 11/30/2007   Allergic rhinitis 11/30/2007   GERD 11/30/2007   ACTINIC KERATOSIS 11/30/2007   Disorder of bursae and tendons in shoulder region 11/30/2007   SYNCOPE, HX OF 11/30/2007   Past Medical History:  Past Medical History:  Diagnosis Date   Actinic keratosis 11/30/2007   Qualifier: Diagnosis of  By: Avelina MD, Amy     ALLERGIC RHINITIS 11/30/2007   Qualifier: Diagnosis of  By: Avelina MD, Amy     Arthritis    Burning chest pain 12/02/2011   Chest pain 11/26/2011   Esophageal stricture    EGD 11/07.  stricture dilated.  hiatal hernia, gastritis   FATIGUE, CHRONIC 09/10/2008   Qualifier: Diagnosis of  By: Avelina MD, Amy     GERD 11/30/2007   Qualifier: Diagnosis of  By: Avelina MD, Amy     Hyperlipidemia 06/13/2013   Murmur, cardiac 11/04/2012   NEOPLASM, SKIN, UNCERTAIN BEHAVIOR 02/15/2008   Qualifier: Diagnosis of  By: Avelina MD, Amy     OBESITY 11/30/2007   Qualifier: Diagnosis of  By: Avelina MD, Amy     Prediabetes 11/27/2011   ROTATOR CUFF SYNDROME 11/30/2007   Qualifier: Diagnosis of  By: Avelina MD, Amy     SKIN LESION, BENIGN 02/15/2008   Qualifier: Diagnosis of  By: Avelina MD, Amy     SYNCOPE, HX OF 11/30/2007   Qualifier: Diagnosis of  By: Avelina MD, Amy      Tick bite 04/14/2016   Past Surgical History:  Past Surgical History:  Procedure Laterality Date   SKIN CANCER EXCISION     HPI:  Pt is 78 yo M admitted with dizziness and near syncope s/p TNK. On evaluation, he was noted to have some facial weakness as well as dysarthria. CT head (12/19)- no acute intracranial abnormality . MRI results pending. PMHx: HLD,syncope, HTN.   Assessment / Plan / Recommendation Clinical Impression  Pt presents with suspected cognitive communication deficits. Portions of [employer] Mental Status Examination (SLUMS) utilized to assess cognitive function. Pt noted to be fully oriented and scored 5/5 in immedate and delayed recall of listed words. He demonstrated difficulty with higher level executive functions/reasoning for mathematical calculation task, scoring 0/2 on this task. 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 Speech in conversation was quick at times, but mostly 100% intelligible. No evidence of aphasia noted during eval.       PLAN: Given MRI results are still pending, recommend SLP f/u briefly for further diagnostic tx of cognitive functions, especially if MRI is positive. If pt is to be discharged prior to next SLP visit, recommend f/u with OP SLP if any cognitive deficits persist. Pt and wife expressed understanding and agreement with plan.    SLP Assessment  SLP Recommendation/Assessment: Patient needs continued  Speech Language Pathology Services SLP Visit Diagnosis: Cognitive communication deficit [phone])     Assistance Recommended at Discharge     Functional Status Assessment Patient has had a recent decline in their functional status and demonstrates the ability to make significant improvements in function in a reasonable and predictable amount of time.  Frequency and Duration min 2x/week  2 weeks      SLP Evaluation Cognition  Overall Cognitive Status: Impaired/Different from  baseline Arousal/Alertness: Awake/alert Orientation Level: Oriented X4 Year: 2025 Month: December Day of Week: Correct Attention: Sustained Sustained Attention: Appears intact Memory: Appears intact Executive Function: Reasoning;Decision Making;Self Monitoring;Self Correcting Reasoning: Impaired Reasoning Impairment: Verbal complex Decision Making: Impaired Decision Making Impairment: Verbal complex Self Monitoring: Impaired Self Monitoring Impairment: Verbal complex Self Correcting: Impaired Self Correcting Impairment: Verbal complex       Comprehension  Auditory Comprehension Overall Auditory Comprehension: Appears within functional limits for tasks assessed Visual Recognition/Discrimination Discrimination: Not tested Reading Comprehension Reading Status: Not tested    Expression Expression Primary Mode of Expression: Verbal Verbal Expression Overall Verbal Expression: Appears within functional limits for tasks assessed Written Expression Written Expression: Not tested   Oral / Motor  Oral Motor/Sensory Function Overall Oral Motor/Sensory Function:  (facial symmetry, no obvious deficits) Motor Speech Overall Motor Speech: Appears within functional limits for tasks assessed             Wilder Kin, MA, CCC-SLP Acute Rehabilitation Services Office Number: [phone]  Wilder KANDICE Kin 11/04/2024, 4:19 PM

## 2024-11-04 NOTE — Evaluation (Signed)
 Physical Therapy Brief Evaluation and Discharge Note Patient Details Name: [name] MRN: [id number] DOB: 1946-02-23 Today's Date: 11/04/2024   History of Present Illness  78 yo M adm 10/1924 with dizziness and near syncope s/p TNK. 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 Pt without dizziness, nausea, weakness or sensation deficits this session. Pt able to perform all transfers without assist, recalls events leading to admission and no noted visual deficits with pt reporting fatigue with activity but otherwise at baseline function. Pt without further therapy needs at this time, will sign off with pt in agreement.  Pre gait 155/85 Post gait 177/80 with RN aware       PT Assessment Patient does not need any further PT services  Assistance Needed at Discharge  PRN    Equipment Recommendations None recommended by PT  Recommendations for Other Services       Precautions/Restrictions Precautions Precautions: None        Mobility  Bed Mobility   Supine/Sidelying to sit: Modified independent (Device/Increased time)      Transfers Overall transfer level: Modified independent                      Ambulation/Gait Ambulation/Gait assistance: Modified independent (Device/Increase time) Gait Distance (Feet): 450 Feet Assistive device: None Gait Pattern/deviations: Step-through pattern, Decreased stride length, Trunk flexed Gait Speed: [facility]    Home Activity Instructions    Stairs            Modified Rankin (Stroke Patients Only) Modified Rankin (Stroke Patients Only) Pre-Morbid Rankin Score: No symptoms Modified Rankin: No symptoms      Balance Overall balance assessment: No apparent balance deficits (not formally assessed)                        Pertinent Vitals/Pain   Pain Assessment Pain Assessment: No/denies pain     Home Living Family/patient expects to  be discharged to:: Private residence Living Arrangements: Spouse/significant other Available Help at Discharge: Available 24 hours/day Home Environment: Stairs to enter   Home Equipment: [place] - single point        Prior Function Level of Independence: Independent      UE/LE Assessment   UE ROM/Strength/Tone/Coordination: WFL    LE ROM/Strength/Tone/Coordination: [facility]      Communication   Communication Communication: No apparent difficulties     Cognition Overall Cognitive Status: Appears within functional limits for tasks assessed/performed       General Comments      Exercises     Assessment/Plan    PT Problem List         PT Visit Diagnosis Other abnormalities of gait and mobility (R26.89)    No Skilled PT All education completed;Patient at baseline level of functioning;Patient is modified independent with all activity/mobility   Co-evaluation                AMPAC 6 Clicks Help needed turning from your back to your side while in a flat bed without using bedrails?: None Help needed moving from lying on your back to sitting on the side of a flat bed without using bedrails?: None Help needed moving to and from a bed to a chair (including a wheelchair)?: None Help needed standing up from a chair using your arms (e.g., wheelchair or bedside chair)?: A Little Help needed to walk in hospital room?: A Little Help needed  climbing 3-5 steps with a railing? : A Little 6 Click Score: 21      End of Session Equipment Utilized During Treatment: Gait belt Activity Tolerance: Patient tolerated treatment well Patient left: in chair;with call bell/phone within reach;with family/visitor present;with nursing/sitter in room Nurse Communication: Mobility status PT Visit Diagnosis: Other abnormalities of gait and mobility (R26.89)     Time: 8840-8778 PT Time Calculation (min) (ACUTE ONLY): 22 min  Charges:   PT Evaluation $PT Eval Low Complexity: 1 Low       Anaid Haney P, PT Acute Rehabilitation Services Office: [phone]   Lenoard NOVAK Armany Mano  11/04/2024, 12:25 PM

## 2024-11-04 NOTE — Progress Notes (Signed)
 STROKE TEAM PROGRESS NOTE    SIGNIFICANT HOSPITAL EVENTS 12/19: Patient admitted with vertigo, dysarthria and left facial droop, given TNK to treat stroke  INTERIM HISTORY/SUBJECTIVE  Patient has remained hemodynamically stable and afebrile overnight.  MRI shows small right cerebellar stroke.  OBJECTIVE  CBC    Component Value Date/Time   WBC 10.5 11/03/2024 1548   RBC 4.20 (L) 11/03/2024 1548   HGB 13.1 11/03/2024 1548   HCT 38.1 (L) 11/03/2024 1548   PLT 265 11/03/2024 1548   MCV 90.7 11/03/2024 1548   MCV 91.5 05/21/2017 1555   MCH 31.2 11/03/2024 1548   MCHC 34.4 11/03/2024 1548   RDW 13.0 11/03/2024 1548   LYMPHSABS 2.7 11/03/2024 1548   MONOABS 1.1 (H) 11/03/2024 1548   EOSABS 0.2 11/03/2024 1548   BASOSABS 0.1 11/03/2024 1548    BMET    Component Value Date/Time   NA 139 11/03/2024 1548   NA 142 05/21/2017 1546   K 3.6 11/03/2024 1548   CL 104 11/03/2024 1548   CO2 23 11/03/2024 1548   GLUCOSE 106 (H) 11/03/2024 1548   BUN 17 11/03/2024 1548   BUN 13 05/21/2017 1546   CREATININE 0.85 11/03/2024 1548   CALCIUM  9.5 11/03/2024 1548   GFRNONAA >60 11/03/2024 1548    IMAGING past 24 hours CT ANGIO HEAD NECK W WO CM (CODE STROKE) Result Date: 11/03/2024 EXAM: CTA HEAD AND NECK WITH AND WITHOUT 11/03/2024 03:29:57 PM TECHNIQUE: CTA of the head and neck was performed with and without the administration of intravenous contrast. Multiplanar 2D and/or 3D reformatted images are provided for review. Automated exposure control, iterative reconstruction, and/or weight based adjustment of the mA/kV was utilized to reduce the radiation dose to as low as reasonably achievable. Stenosis of the internal carotid arteries measured using NASCET criteria. COMPARISON: None available CLINICAL HISTORY: Neuro deficit, acute, stroke suspected. FINDINGS: CTA NECK: AORTIC ARCH AND ARCH VESSELS: No dissection or arterial injury. No significant stenosis of the brachiocephalic or subclavian  arteries. CERVICAL CAROTID ARTERIES: Punctate focus of calcified plaque at the right carotid bifurcation. No dissection, arterial injury, or hemodynamically significant stenosis by NASCET criteria. CERVICAL VERTEBRAL ARTERIES: Mildly dominant right vertebral artery. No dissection, arterial injury, or significant stenosis. LUNGS AND MEDIASTINUM: Unremarkable. SOFT TISSUES: No acute abnormality. BONES: Moderate multilevel cervical disc and facet degeneration. CTA HEAD: ANTERIOR CIRCULATION: The intracranial internal carotid arteries are widely patent with minimal nonstenotic atherosclerosis. The anterior cerebral arteries and middle cerebral arteries are patent without evidence of a proximal branch occlusion or significant proximal stenosis. No aneurysm. POSTERIOR CIRCULATION: The intracranial vertebral arteries are patent to the basilar with mild atherosclerotic irregularity of the right V4 segment but no significant stenosis. The basilar artery is widely patent. Posterior communicating arteries are diminutive or absent. Both posterior cerebral arteries are patent without evidence of a significant proximal stenosis. No aneurysm. OTHER: No dural venous sinus thrombosis on this non-dedicated study. IMPRESSION: 1. Minimal atherosclerosis without a large vessel occlusion or significant proximal stenosis in the head or neck. 2. These results were communicated to Dr. KYM Ross at 3:45 PM on 11/03/2024 by secure text page via the [facility] messaging system. Electronically signed by: Dasie Hamburg MD 11/03/2024 03:51 PM EST RP Workstation: [id number]   CT HEAD CODE STROKE WO CONTRAST Result Date: 11/03/2024 EXAM: CT HEAD WITHOUT CONTRAST 11/03/2024 03:18:50 PM TECHNIQUE: CT of the head was performed without the administration of intravenous contrast. Automated exposure control, iterative reconstruction, and/or weight based adjustment of the mA/kV was utilized  to reduce the radiation dose to as low as reasonably achievable.  COMPARISON: CT head without contrast 07/01/2020. CLINICAL HISTORY: Neuro deficit, acute, stroke suspected. Multiple episodes of dizziness and near syncope beginning at 11:45 am. FINDINGS: BRAIN AND VENTRICLES: No acute hemorrhage. No evidence of acute infarct. A remote lacunar infarct is present in the right caudate head. Cortical sulci and white matter changes are stable. No hydrocephalus. No extra-axial collection. No mass effect or midline shift. ORBITS: Bilateral lens replacements are noted. The globes and orbits are otherwise within normal limits. SINUSES: No acute abnormality. SOFT TISSUES AND SKULL: No acute soft tissue abnormality. No skull fracture. [place] Stroke Program Early CT Score (ASPECTS) Ganglionic (caudate, IC, lentiform nucleus, insula, M1-M3): 7 Supraganglionic (M4-M6): 3 Total: 10 IMPRESSION: 1. No acute intracranial abnormality. 2. Remote lacunar infarct in the right caudate head. 3. ASPECTS: 10. 4. These results were communicated to Dr. Matthews at 3:26 pm on 11/03/2024 by secure text page via the [facility] messaging system. Electronically signed by: Lonni Necessary MD 11/03/2024 03:28 PM EST RP Workstation: [id number]    Vitals:   11/04/24 1200 11/04/24 1202 11/04/24 1300 11/04/24 1400  BP: (!) 155/83 (!) 155/83 (!) 156/74 (!) 153/75  Pulse: 69 72 64 66  Resp: 15  18 15   Temp:      TempSrc:      SpO2: 96% 95% 93% 94%     PHYSICAL EXAM General:  Alert, well-nourished, well-developed patient in no acute distress Psych:  Mood and affect appropriate for situation CV: Regular rate and rhythm on monitor Respiratory:  Regular, unlabored respirations on room air   NEURO:  Mental Status: AA&Ox3, patient is able to give clear and coherent history Speech/Language: speech is without dysarthria or aphasia.    Cranial Nerves:  II: PERRL. Visual fields full.  III, IV, VI: EOMI. Eyelids elevate symmetrically.  V: Sensation is intact to light touch and symmetrical to face.  VII:  Subtle left facial droop VIII: hearing intact to voice. IX, X: Phonation is normal.  KP:Dynloizm shrug 5/5. XII: tongue is midline without fasciculations. Motor: 5/5 strength to all muscle groups tested.  Tone: is normal and bulk is normal Sensation- Intact to light touch bilaterally.  Coordination: FTN intact bilaterally Gait- deferred  Dix-Hallpike maneuver negative  Most Recent NIH  1a Level of Conscious.: 0 1b LOC Questions: 0 1c LOC Commands: 0 2 Best Gaze: 0 3 Visual: 0 4 Facial Palsy: 1 5a Motor Arm - left: 0 5b Motor Arm - Right: 0 6a Motor Leg - Left: 0 6b Motor Leg - Right: 0 7 Limb Ataxia: 0 8 Sensory: 0 9 Best Language: 0 10 Dysarthria: 0 11 Extinct. and Inatten.: 0 TOTAL: 1   ASSESSMENT/PLAN  Mr. [name] is a 78 y.o. male with history of hypertension and hyperlipidemia admitted for acute onset diplopia, vertigo and left facial droop.  Patient was given TNK to treat potential stroke and was transferred here for further care.  He reports that dizziness and diplopia have resolved, but facial droop persists.  MRI reveals small right cerebellar infarct.  NIH on Admission 1  Acute Ischemic Infarct:  right cerebellar infarct s/p TNK Etiology: Small vessel disease Code Stroke CT head No acute abnormality. ASPECTS 10.    CTA head & neck no LVO or hemodynamically significant stenosis MRI small right cerebellar infarct, official read pending 2D Echo pending LDL 121 HgbA1c 5.6 VTE prophylaxis -SCDs No antithrombotic prior to admission, now on aspirin  81 mg daily and clopidogrel   75 mg daily for 3 weeks and then aspirin  alone. Therapy recommendations:  Pending Disposition: Pending  Hypertension Home meds: None Stable Blood Pressure Goal: BP less than 180/105   Hyperlipidemia Home meds: None LDL 121, goal < 70 Add atorvastatin  40 mg daily Continue statin at discharge  Other Stroke Risk Factors Advanced age   Other Active Problems None  Hospital  day # 1  Patient seen by NP with MD, MD to edit note as needed. Emylia Latella E Everitt Clint Kill , MSN, AGACNP-BC [facility] See Amion for schedule and pager information 11/04/2024 3:29 PM    To contact Stroke Continuity provider, please refer to [email or web address]. After hours, contact General Neurology

## 2024-11-04 NOTE — Progress Notes (Signed)
 PT Cancellation Note  Patient Details Name: [name] MRN: [id number] DOB: Mar 24, 1946   Cancelled Treatment:    Reason Eval/Treat Not Completed: Active bedrest order   [name] [name] 11/04/2024, 7:13 AM Daniel SQUIBB, PT Acute Rehabilitation Services Office: [phone]

## 2024-11-04 NOTE — Progress Notes (Signed)
 OT Cancellation Note  Patient Details Name: [name] MRN: [id number] DOB: [birth year]   Cancelled Treatment:    Reason Eval/Treat Not Completed: Active bedrest order;Medical issues which prohibited therapy Patient is currently on active bedrest and with TNK administration at 1545 on 11/03/24. Per acute rehab protocol, OT is to wait 24 hours status post TNK administration unless otherwise cleared by MD. OT will complete evaluation when medically appropriate.   Ronal Gift E. Cahlil Sattar, OTR/L Acute Rehabilitation Services [phone]   Ronal Gift Salt 11/04/2024, 7:04 AM

## 2024-11-04 NOTE — Progress Notes (Signed)
 OT Screen Note  Patient Details Name: [name] MRN: [id number] DOB: 1946/02/20   Screened Treatment:    Reason Eval/Treat Not Completed: OT screened, no needs identified, will sign off Per PT, patient is back at his baseline with no acute OT needs. OT will sign off at this time, please re-consult if further acute needs arise.   Ronal Gift E. Ridley Dileo, OTR/L Acute Rehabilitation Services [phone]   Ronal Gift Salt 11/04/2024, 12:34 PM

## 2024-11-05 ENCOUNTER — Inpatient Hospital Stay (HOSPITAL_COMMUNITY)

## 2024-11-05 ENCOUNTER — Encounter (HOSPITAL_COMMUNITY): Payer: Self-pay | Admitting: Neurology

## 2024-11-05 DIAGNOSIS — I6389 Other cerebral infarction: Secondary | ICD-10-CM | POA: Diagnosis not present

## 2024-11-05 DIAGNOSIS — R29701 NIHSS score 1: Secondary | ICD-10-CM | POA: Diagnosis not present

## 2024-11-05 DIAGNOSIS — I63441 Cerebral infarction due to embolism of right cerebellar artery: Secondary | ICD-10-CM

## 2024-11-05 LAB — ECHOCARDIOGRAM COMPLETE
Area-P 1/2: 3.01 cm2
Height: 66 in
S' Lateral: 3.1 cm
Weight: 2998.26 [oz_av]

## 2024-11-05 MED ORDER — LOSARTAN POTASSIUM 50 MG PO TABS
25.0000 mg | ORAL_TABLET | Freq: Once | ORAL | Status: AC
  Administered 2024-11-05: 25 mg via ORAL
  Filled 2024-11-05: qty 1

## 2024-11-05 MED ORDER — ASPIRIN 81 MG PO CHEW: 81.0000 mg | CHEWABLE_TABLET | Freq: Every day | ORAL | 2 refills | Status: AC

## 2024-11-05 MED ORDER — LOSARTAN POTASSIUM 50 MG PO TABS: 50.0000 mg | ORAL_TABLET | Freq: Every day | ORAL | Status: DC

## 2024-11-05 MED ORDER — LOSARTAN POTASSIUM 50 MG PO TABS: 50.0000 mg | ORAL_TABLET | Freq: Every day | ORAL | 2 refills | Status: AC

## 2024-11-05 MED ORDER — ATORVASTATIN CALCIUM 40 MG PO TABS: 40.0000 mg | ORAL_TABLET | Freq: Every day | ORAL | 2 refills | Status: AC

## 2024-11-05 MED ORDER — PANTOPRAZOLE SODIUM 40 MG PO TBEC: 40.0000 mg | DELAYED_RELEASE_TABLET | Freq: Every day | ORAL | 2 refills | Status: AC

## 2024-11-05 MED ORDER — CLOPIDOGREL BISULFATE 75 MG PO TABS: 75.0000 mg | ORAL_TABLET | Freq: Every day | ORAL | 0 refills | Status: AC

## 2024-11-05 NOTE — Discharge Instructions (Addendum)
 [name], you were admitted with dizziness, double vision and a left facial droop.  You were given TNK to treat your stroke.  Some small strokes in the right side of your cerebellum were found on MRI.  You will need to follow up with outpatient speech therapy and be seen in the stroke clinic in 8 weeks.  Please take aspirin  and Plavix  daily for 3 weeks and then aspirin  alone.  Please stop taking your omeprazole as it interacts with Plavix  and take pantoprazole  instead.  You will need to wear a 30 day cardiac monitor, which will be mailed to your house.

## 2024-11-05 NOTE — Discharge Summary (Cosign Needed)
 Stroke Discharge Summary  Patient ID: [name]   MRN: [id number]      DOB: 1946-09-10  Date of Admission: 11/03/2024 Date of Discharge: 11/05/2024  Attending Physician:  Stroke, Md, MD Consultant(s):    None  Patient's PCP:  Daniel Lot, PA-C  DISCHARGE PRIMARY DIAGNOSIS:  Acute Ischemic Infarct:  right cerebellar infarcts s/p TNK Etiology: Small vessel disease versus cardioembolic  Patient Active Problem List   Diagnosis Date Noted   Acute ischemic stroke (HCC) 11/03/2024   Stroke (HCC) 11/03/2024   Hx of adenomatous colonic polyps 03/24/2018   Tick bite 04/14/2016   Hyperlipidemia 06/13/2013   Murmur, cardiac 11/04/2012   Burning chest pain 12/02/2011   High cholesterol 11/27/2011   Prediabetes 11/27/2011   Chest pain 11/26/2011   FATIGUE, CHRONIC 09/10/2008   NEOPLASM, SKIN, UNCERTAIN BEHAVIOR 02/15/2008   Disorder of skin or subcutaneous tissue 02/15/2008   OBESITY 11/30/2007   Allergic rhinitis 11/30/2007   GERD 11/30/2007   ACTINIC KERATOSIS 11/30/2007   Disorder of bursae and tendons in shoulder region 11/30/2007   SYNCOPE, HX OF 11/30/2007     Allergies as of 11/05/2024   No Known Allergies      Medication List     STOP taking these medications    esomeprazole 40 MG capsule Commonly known as: NEXIUM       TAKE these medications    aspirin  81 MG chewable tablet Chew 1 tablet (81 mg total) by mouth daily. Start taking on: November 06, 2024   atorvastatin  40 MG tablet Commonly known as: LIPITOR Take 1 tablet (40 mg total) by mouth daily. Start taking on: November 06, 2024   CAL-MAG-ZINC PO Take 1 tablet by mouth daily.   cholecalciferol 25 MCG (1000 UNIT) tablet Commonly known as: VITAMIN D3 Take 1,000 Units by mouth daily.   clopidogrel  75 MG tablet Commonly known as: PLAVIX  Take 1 tablet (75 mg total) by mouth daily. Start taking on: November 06, 2024   cyanocobalamin  1000 MCG tablet Commonly known as: VITAMIN  B12 Take 1,000 mcg by mouth daily.   finasteride  5 MG tablet Commonly known as: PROSCAR  Take 5 mg by mouth daily.   gabapentin  300 MG capsule Commonly known as: NEURONTIN  Take 300 mg by mouth at bedtime.   losartan  50 MG tablet Commonly known as: COZAAR  Take 1 tablet (50 mg total) by mouth daily. Start taking on: November 06, 2024   melatonin 3 MG Tabs tablet Take 4.5 mg by mouth at bedtime.   multivitamin with minerals Tabs tablet Take 1 tablet by mouth daily.   pantoprazole  40 MG tablet Commonly known as: PROTONIX  Take 1 tablet (40 mg total) by mouth daily. Start taking on: November 06, 2024   PHOSPHATIDYL CHOLINE PO Take 1 tablet by mouth daily.   PREVAGEN PO Take 1 tablet by mouth daily.   VITAMIN C PO Take 1 tablet by mouth daily.        LABORATORY STUDIES CBC    Component Value Date/Time   WBC 10.5 11/03/2024 1548   RBC 4.20 (L) 11/03/2024 1548   HGB 13.1 11/03/2024 1548   HCT 38.1 (L) 11/03/2024 1548   PLT 265 11/03/2024 1548   MCV 90.7 11/03/2024 1548   MCV 91.5 05/21/2017 1555   MCH 31.2 11/03/2024 1548   MCHC 34.4 11/03/2024 1548   RDW 13.0 11/03/2024 1548   LYMPHSABS 2.7 11/03/2024 1548   MONOABS 1.1 (H) 11/03/2024 1548   EOSABS 0.2 11/03/2024 1548  BASOSABS 0.1 11/03/2024 1548   CMP    Component Value Date/Time   NA 139 11/03/2024 1548   NA 142 05/21/2017 1546   K 3.6 11/03/2024 1548   CL 104 11/03/2024 1548   CO2 23 11/03/2024 1548   GLUCOSE 106 (H) 11/03/2024 1548   BUN 17 11/03/2024 1548   BUN 13 05/21/2017 1546   CREATININE 0.85 11/03/2024 1548   CALCIUM  9.5 11/03/2024 1548   PROT 6.7 11/03/2024 1548   PROT 7.0 01/09/2019 0830   ALBUMIN 3.9 11/03/2024 1548   ALBUMIN 4.4 01/09/2019 0830   AST 19 11/03/2024 1548   ALT 16 11/03/2024 1548   ALKPHOS 57 11/03/2024 1548   BILITOT 0.3 11/03/2024 1548   BILITOT 0.4 01/09/2019 0830   GFRNONAA >60 11/03/2024 1548   GFRAA >60 12/15/2018 2016   COAGS Lab Results  Component  Value Date   INR 1.1 11/03/2024   Lipid Panel    Component Value Date/Time   CHOL 182 11/04/2024 0632   CHOL 201 (H) 01/09/2019 0830   TRIG 69 11/04/2024 0632   HDL 48 11/04/2024 0632   HDL 46 01/09/2019 0830   CHOLHDL 3.8 11/04/2024 0632   VLDL 14 11/04/2024 0632   LDLCALC 121 (H) 11/04/2024 0632   LDLCALC 112 (H) 01/09/2019 0830   HgbA1C  Lab Results  Component Value Date   HGBA1C 5.6 11/03/2024   Alcohol Level No results found for: [facility]   SIGNIFICANT DIAGNOSTIC STUDIES ECHOCARDIOGRAM COMPLETE Result Date: 11/05/2024    ECHOCARDIOGRAM REPORT   Patient Name:   [name] Date of Exam: 11/05/2024 Medical Rec #:  [id number]       Height:       66.5 in Accession #:    [id number]      Weight:       188.5 lb Date of Birth:  Jan 04, 1946       BSA:          1.961 m Patient Age:    78 years        BP:           160/92 mmHg Patient Gender: M               HR:           66 bpm. Exam Location:  Inpatient Procedure: 2D Echo, Cardiac Doppler and Color Doppler (Both Spectral and Color            Flow Doppler were utilized during procedure). Indications:    Stroke 434.91/163.9  History:        Patient has prior history of Echocardiogram examinations, most                 recent 11/29/2022. Stroke, Signs/Symptoms:Syncope; Risk                 Factors:Dyslipidemia.  Sonographer:    [name] Referring Phys: [phone] MCNEILL P KIRKPATRICK IMPRESSIONS  1. Left ventricular ejection fraction, by estimation, is 55 to 60%. The left ventricle has normal function. The left ventricle has no regional wall motion abnormalities. There is mild concentric left ventricular hypertrophy. Left ventricular diastolic parameters are indeterminate.  2. Right ventricular systolic function is normal. The right ventricular size is normal. Tricuspid regurgitation signal is inadequate for assessing PA pressure.  3. The mitral valve is normal in structure. No evidence of mitral valve regurgitation. No evidence of mitral stenosis.   4. The aortic valve is calcified. There is mild calcification of the aortic  valve. Aortic valve regurgitation is not visualized. Aortic valve sclerosis/calcification is present, without any evidence of aortic stenosis.  5. The inferior vena cava is normal in size with greater than 50% respiratory variability, suggesting right atrial pressure of 3 mmHg. FINDINGS  Left Ventricle: Left ventricular ejection fraction, by estimation, is 55 to 60%. The left ventricle has normal function. The left ventricle has no regional wall motion abnormalities. The left ventricular internal cavity size was normal in size. There is  mild concentric left ventricular hypertrophy. Left ventricular diastolic parameters are indeterminate. Right Ventricle: The right ventricular size is normal. No increase in right ventricular wall thickness. Right ventricular systolic function is normal. Tricuspid regurgitation signal is inadequate for assessing PA pressure. Left Atrium: Left atrial size was normal in size. Right Atrium: Right atrial size was normal in size. Pericardium: There is no evidence of pericardial effusion. Presence of epicardial fat layer. Mitral Valve: The mitral valve is normal in structure. No evidence of mitral valve regurgitation. No evidence of mitral valve stenosis. Tricuspid Valve: The tricuspid valve is normal in structure. Tricuspid valve regurgitation is not demonstrated. No evidence of tricuspid stenosis. Aortic Valve: The aortic valve is calcified. There is mild calcification of the aortic valve. Aortic valve regurgitation is not visualized. Aortic valve sclerosis/calcification is present, without any evidence of aortic stenosis. Pulmonic Valve: The pulmonic valve was normal in structure. Pulmonic valve regurgitation is not visualized. No evidence of pulmonic stenosis. Aorta: The aortic root is normal in size and structure. Venous: The inferior vena cava is normal in size with greater than 50% respiratory variability,  suggesting right atrial pressure of 3 mmHg. IAS/Shunts: No atrial level shunt detected by color flow Doppler.  LEFT VENTRICLE PLAX 2D LVIDd:         4.40 cm   Diastology LVIDs:         3.10 cm   LV e' medial:    5.87 cm/s LV PW:         1.10 cm   LV E/e' medial:  13.3 LV IVS:        1.10 cm   LV e' lateral:   11.70 cm/s LVOT diam:     1.80 cm   LV E/e' lateral: 6.7 LV SV:         73 LV SV Index:   37 LVOT Area:     2.54 cm  RIGHT VENTRICLE             IVC RV Basal diam:  3.90 cm     IVC diam: 2.40 cm RV S prime:     12.50 cm/s TAPSE (M-mode): 2.4 cm      PULMONARY VEINS                             Diastolic Velocity: 33.50 cm/s                             S/D Velocity:       1.30                             Systolic Velocity:  44.80 cm/s LEFT ATRIUM             Index        RIGHT ATRIUM           Index LA diam:  3.80 cm 1.94 cm/m   RA Area:     13.50 cm LA Vol (A2C):   38.7 ml 19.73 ml/m  RA Volume:   29.70 ml  15.14 ml/m LA Vol (A4C):   49.6 ml 25.29 ml/m LA Biplane Vol: 47.0 ml 23.97 ml/m  AORTIC VALVE LVOT Vmax:   127.00 cm/s LVOT Vmean:  82.300 cm/s LVOT VTI:    0.286 m  AORTA Ao Root diam: 3.40 cm Ao Asc diam:  3.80 cm MITRAL VALVE MV Area (PHT): 3.01 cm     SHUNTS MV Decel Time: 252 msec     Systemic VTI:  0.29 m MV E velocity: 78.00 cm/s   Systemic Diam: 1.80 cm MV A velocity: 103.00 cm/s MV E/A ratio:  0.76 Kardie Tobb DO Electronically signed by Dub Huntsman DO Signature Date/Time: 11/05/2024/1:32:20 PM    Final    MR BRAIN WO CONTRAST Result Date: 11/04/2024 CLINICAL DATA:  Follow-up examination for stroke. EXAM: MRI HEAD WITHOUT CONTRAST TECHNIQUE: Multiplanar, multiecho pulse sequences of the brain and surrounding structures were obtained without intravenous contrast. COMPARISON:  Comparison made with CTs from 11/03/2024. FINDINGS: Brain: Cerebral volume within normal limits. Patchy T2/FLAIR hyperintensity involving the periventricular deep white matter both cerebral hemispheres,  consistent chronic small vessel ischemic disease, mild to moderate in nature. Scattered patchy small volume foci of restricted diffusion are seen involving the inferior right cerebellum, right PICA distribution, consistent with small acute ischemic infarcts. No significant associated hemorrhage or mass effect. No other evidence for acute or subacute ischemia. Gray-white matter differentiation otherwise maintained. No areas of chronic cortical infarction. No acute or significant chronic intracranial blood products. No mass lesion, midline shift or mass effect no hydrocephalus or extra-axial fluid collection. Pituitary gland within normal limits. Vascular: Major intracranial vascular flow voids are maintained. Skull and upper cervical spine: Craniocervical junction within normal limits. Bone marrow signal intensity normal. No scalp soft tissue abnormality Sinuses/Orbits: Prior bilateral ocular lens replacement. Mild scattered mucosal thickening present about the ethmoidal air cells. Small left maxillary sinus retention cyst. Trace bilateral mastoid effusions noted. Other: None. IMPRESSION: 1. Scattered small volume acute ischemic nonhemorrhagic right PICA distribution infarcts involving the inferior right cerebellum. 2. Underlying mild to moderate chronic microvascular ischemic disease. Electronically Signed   By: Morene Hoard M.D.   On: 11/04/2024 20:29   CT ANGIO HEAD NECK W WO CM (CODE STROKE) Result Date: 11/03/2024 EXAM: CTA HEAD AND NECK WITH AND WITHOUT 11/03/2024 03:29:57 PM TECHNIQUE: CTA of the head and neck was performed with and without the administration of intravenous contrast. Multiplanar 2D and/or 3D reformatted images are provided for review. Automated exposure control, iterative reconstruction, and/or weight based adjustment of the mA/kV was utilized to reduce the radiation dose to as low as reasonably achievable. Stenosis of the internal carotid arteries measured using NASCET criteria.  COMPARISON: None available CLINICAL HISTORY: Neuro deficit, acute, stroke suspected. FINDINGS: CTA NECK: AORTIC ARCH AND ARCH VESSELS: No dissection or arterial injury. No significant stenosis of the brachiocephalic or subclavian arteries. CERVICAL CAROTID ARTERIES: Punctate focus of calcified plaque at the right carotid bifurcation. No dissection, arterial injury, or hemodynamically significant stenosis by NASCET criteria. CERVICAL VERTEBRAL ARTERIES: Mildly dominant right vertebral artery. No dissection, arterial injury, or significant stenosis. LUNGS AND MEDIASTINUM: Unremarkable. SOFT TISSUES: No acute abnormality. BONES: Moderate multilevel cervical disc and facet degeneration. CTA HEAD: ANTERIOR CIRCULATION: The intracranial internal carotid arteries are widely patent with minimal nonstenotic atherosclerosis. The anterior cerebral arteries and middle cerebral arteries are patent  without evidence of a proximal branch occlusion or significant proximal stenosis. No aneurysm. POSTERIOR CIRCULATION: The intracranial vertebral arteries are patent to the basilar with mild atherosclerotic irregularity of the right V4 segment but no significant stenosis. The basilar artery is widely patent. Posterior communicating arteries are diminutive or absent. Both posterior cerebral arteries are patent without evidence of a significant proximal stenosis. No aneurysm. OTHER: No dural venous sinus thrombosis on this non-dedicated study. IMPRESSION: 1. Minimal atherosclerosis without a large vessel occlusion or significant proximal stenosis in the head or neck. 2. These results were communicated to Dr. KYM Ross at 3:45 PM on 11/03/2024 by secure text page via the [facility] messaging system. Electronically signed by: Dasie Hamburg MD 11/03/2024 03:51 PM EST RP Workstation: [id number]   CT HEAD CODE STROKE WO CONTRAST Result Date: 11/03/2024 EXAM: CT HEAD WITHOUT CONTRAST 11/03/2024 03:18:50 PM TECHNIQUE: CT of the head was performed  without the administration of intravenous contrast. Automated exposure control, iterative reconstruction, and/or weight based adjustment of the mA/kV was utilized to reduce the radiation dose to as low as reasonably achievable. COMPARISON: CT head without contrast 07/01/2020. CLINICAL HISTORY: Neuro deficit, acute, stroke suspected. Multiple episodes of dizziness and near syncope beginning at 11:45 am. FINDINGS: BRAIN AND VENTRICLES: No acute hemorrhage. No evidence of acute infarct. A remote lacunar infarct is present in the right caudate head. Cortical sulci and white matter changes are stable. No hydrocephalus. No extra-axial collection. No mass effect or midline shift. ORBITS: Bilateral lens replacements are noted. The globes and orbits are otherwise within normal limits. SINUSES: No acute abnormality. SOFT TISSUES AND SKULL: No acute soft tissue abnormality. No skull fracture. [place] Stroke Program Early CT Score (ASPECTS) Ganglionic (caudate, IC, lentiform nucleus, insula, M1-M3): 7 Supraganglionic (M4-M6): 3 Total: 10 IMPRESSION: 1. No acute intracranial abnormality. 2. Remote lacunar infarct in the right caudate head. 3. ASPECTS: 10. 4. These results were communicated to Dr. Ross at 3:26 pm on 11/03/2024 by secure text page via the [facility] messaging system. Electronically signed by: Lonni Necessary MD 11/03/2024 03:28 PM EST RP Workstation: [id number]       HISTORY OF PRESENT ILLNESS 78 y.o. patient with history of hypertension and hyperlipidemia was admitted with acute onset diplopia, vertigo and left facial droop  HOSPITAL COURSE Patient was given TNK to treat potential stroke and was transferred to [facility] for further care.  Dizziness and diplopia resolved.  MRI revealed small right cerebellar infarcts.  Patient is now stable and ready to be discharged home. Acute Ischemic Infarct:  right cerebellar infarct s/p TNK Etiology: Small vessel disease Code Stroke CT head No acute abnormality.  ASPECTS 10.    CTA head & neck no LVO or hemodynamically significant stenosis MRI small right cerebellar infarct, official read pending 2D Echo EF 55 to 60%, mild concentric LVH, normal left atrial size, no atrial level shunt LDL 121 HgbA1c 5.6 VTE prophylaxis -SCDs No antithrombotic prior to admission, now on aspirin  81 mg daily and clopidogrel  75 mg daily for 3 weeks and then aspirin  alone. Therapy recommendations: Outpatient speech therapy Disposition: Home   Hypertension Home meds: None Stable Blood Pressure Goal: BP less than 180/105    Hyperlipidemia Home meds: None LDL 121, goal < 70 Add atorvastatin  40 mg daily Continue statin at discharge   Other Stroke Risk Factors Advanced age     Other Active Problems None  RN Pressure Injury Documentation:     DISCHARGE EXAM  PHYSICAL EXAM General:  Alert, well-nourished, well-developed patient in  no acute distress Psych:  Mood and affect appropriate for situation CV: Regular rate and rhythm on monitor Respiratory:  Regular, unlabored respirations on room air   NEURO:  Mental Status: AA&Ox3  Speech/Language: speech is without dysarthria or aphasia.    Cranial Nerves:  II: PERRL. Visual fields full.  III, IV, VI: EOMI. Eyelids elevate symmetrically.  V: Sensation is intact to light touch and symmetrical to face.  VII: Smile is symmetrical.  VIII: hearing intact to voice. IX, X: Phonation is normal.  KP:Dynloizm shrug 5/5. XII: tongue is midline without fasciculations. Motor: 5/5 strength to all muscle groups tested.  Tone: is normal and bulk is normal Sensation- Intact to light touch bilaterally.  Coordination: FTN with very mild ataxia on the right Gait- deferred  1a Level of Conscious.: 0 1b LOC Questions: 0 1c LOC Commands: 0 2 Best Gaze: 0 3 Visual: 0 4 Facial Palsy: 0 5a Motor Arm - left: 0 5b Motor Arm - Right: 0 6a Motor Leg - Left: 0 6b Motor Leg - Right: 0 7 Limb Ataxia: 1 8 Sensory: 0 9  Best Language: 0 10 Dysarthria: 0 11 Extinct. and Inatten.: 0 TOTAL: 1   Discharge Diet       Diet   Diet Heart Room service appropriate? Yes with Assist; Fluid consistency: Thin   liquids  DISCHARGE PLAN Disposition: Home aspirin  81 mg daily and clopidogrel  75 mg daily for secondary stroke prevention for 3 weeks then aspirin  81 mg daily alone. Ongoing stroke risk factor control by Primary Care Physician at time of discharge Follow-up PCP Daniel Lot, PA-C in 2 weeks. Follow-up in [facility] in 8 weeks, office to schedule an appointment.   35 minutes were spent preparing discharge.  Cortney E Everitt Clint Kill , MSN, AGACNP-BC [facility] See Amion for schedule and pager information 11/05/2024 2:19 PM

## 2024-11-05 NOTE — Progress Notes (Signed)
 Echocardiogram 2D Echocardiogram has been performed.  [name] 11/05/2024, 9:58 AM

## 2024-11-05 NOTE — Evaluation (Signed)
 Physical Therapy Evaluation Patient Details Name: [name] MRN: [id number] DOB: 1946/05/02 Today's Date: 11/05/2024  History of Present Illness  78 yo M adm 11/03/24 with dizziness and near syncope s/p TNK. MRI notable for R cerebellar CVA. PMHx: HLD,syncope, HTN  Clinical Impression  PT returns to reassess pt as RN reports instability when mobilizing earlier in the day and concerns regarding stair negotiation. Pt is able to ambulate for household distances and tolerates multiple dynamic balance challenges without significant balance deviation noted. Pt negotiates one flight of stairs with UE support of a railing, showing improved stability with step-to pattern when descending as opposed to alternating. PT encourages performance of step-to pattern for descending stairs at the time of discharge. Pt declines potential outpatient PT at this time.        If plan is discharge home, recommend the following: Help with stairs or ramp for entrance (supervision for stairs)   Can travel by private vehicle        Equipment Recommendations None recommended by PT  Recommendations for Other Services       Functional Status Assessment Patient has had a recent decline in their functional status and demonstrates the ability to make significant improvements in function in a reasonable and predictable amount of time.     Precautions / Restrictions Precautions Precautions: Fall Recall of Precautions/Restrictions: Intact Restrictions Weight Bearing Restrictions Per Provider Order: No      Mobility  Bed Mobility                    Transfers Overall transfer level: Modified independent Equipment used: None               General transfer comment: increased time    Ambulation/Gait Ambulation/Gait assistance: Supervision Gait Distance (Feet): 400 Feet Assistive device: None Gait Pattern/deviations: Step-through pattern Gait velocity: reduced Gait velocity interpretation:  1.31 - 2.62 ft/sec, indicative of limited community ambulator   General Gait Details: pt tolerates lateral head turns, changes in step length, backward stepping all without significant balance deviation  Stairs Stairs: Yes Stairs assistance: Supervision Stair Management: One rail Right, Alternating pattern, Step to pattern Number of Stairs: 10 General stair comments: pt alterantes steps when ascending. Initially when descending pt performs step-to gait, then switches to alternating pattern with increased lateral sway and dependence on railing for support. PT encourages the pt to perform a step-to pattern for descending steps when returning home  Wheelchair Mobility     Tilt Bed    Modified Rankin (Stroke Patients Only) Modified Rankin (Stroke Patients Only) Pre-Morbid Rankin Score: No symptoms Modified Rankin: Moderately severe disability     Balance Overall balance assessment: Needs assistance Sitting-balance support: No upper extremity supported, Feet supported Sitting balance-Leahy Scale: Good     Standing balance support: No upper extremity supported, During functional activity Standing balance-Leahy Scale: Good           Rhomberg - Eyes Opened: 30     High Level Balance Comments: static standing eyes closed for 30 seconds             Pertinent Vitals/Pain Pain Assessment Pain Assessment: No/denies pain Faces Pain Scale: Hurts a little bit    Home Living Family/patient expects to be discharged to:: Private residence Living Arrangements: Spouse/significant other Available Help at Discharge: Family;Available 24 hours/day Type of Home: House Home Access: Stairs to enter   Entrance Stairs-Number of Steps: 2 Alternate Level Stairs-Number of Steps: flight Home Layout: Two level;Bed/bath upstairs;Full bath  on main level Home Equipment: Cane - single point      Prior Function Prior Level of Function : Independent/Modified  Independent;Driving;Working/employed             Mobility Comments: working as an pensions consultant, reports some 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, 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       Extremity/Trunk Assessment   Upper Extremity Assessment Upper Extremity Assessment: Overall WFL for tasks assessed    Lower Extremity Assessment Lower Extremity Assessment: Overall WFL for tasks assessed    Cervical / Trunk Assessment Cervical / Trunk Assessment: Normal  Communication   Communication Communication: No apparent difficulties    Cognition Arousal: Alert Behavior During Therapy: WFL for tasks assessed/performed   PT - Cognitive impairments: No apparent impairments                         Following commands: Intact       Cueing Cueing Techniques: Verbal cues     General Comments General comments (skin integrity, edema, etc.): VSS on RA    Exercises     Assessment/Plan    PT Assessment Patient needs continued PT services  PT Problem List Decreased balance;Decreased mobility       PT Treatment Interventions DME instruction;Gait training;Stair training;Functional mobility training;Balance training;Neuromuscular re-education;Patient/family education    PT Goals (Current goals can be found in the Care Plan section)  Acute Rehab PT Goals Patient Stated Goal: to return to independence PT Goal Formulation: With patient Time For Goal Achievement: 11/19/24 Potential to Achieve Goals: Good Additional Goals Additional Goal #1: Pt will score >19/24 on the DGI to indicate a reduced risk for falls    Frequency Min 1X/week     Co-evaluation               AM-PAC PT 6 Clicks Mobility  Outcome Measure Help needed turning from your back to your side while in a flat bed without using bedrails?: None Help needed moving from lying on your back to sitting on the side of a flat bed without using bedrails?: None Help needed moving to and from a bed to a chair  (including a wheelchair)?: None Help needed standing up from a chair using your arms (e.g., wheelchair or bedside chair)?: None Help needed to walk in hospital room?: A Little Help needed climbing 3-5 steps with a railing? : A Little 6 Click Score: 22    End of Session Equipment Utilized During Treatment: Gait belt Activity Tolerance: Patient tolerated treatment well Patient left: in chair;with call bell/phone within reach;with chair alarm set Nurse Communication: Mobility status PT Visit Diagnosis: Other abnormalities of gait and mobility (R26.89)    Time: 1200-1220 PT Time Calculation (min) (ACUTE ONLY): 20 min   Charges:   PT Evaluation $PT Eval Low Complexity: 1 Low   PT General Charges $$ ACUTE PT VISIT: 1 Visit         Bernardino JINNY Ruth, PT, DPT Acute Rehabilitation Office [phone]   Bernardino JINNY Ruth 11/05/2024, 1:57 PM

## 2024-11-05 NOTE — Progress Notes (Signed)
 Physical Therapy Quick Note  PT has completed initial evaluation.    Overall, patient at supervision assistance level.   PT Follow up recommended: No follow up PT. Pt declines post-acute PT needs, will follow up with MD in the outpatient setting if he would like to pursue PT after discharge. Equipment recommended:  None recommended Complete evaluation note to follow.     Bernardino JINNY Ruth, PT, DPT Acute Rehabilitation Office [phone]

## 2024-11-06 ENCOUNTER — Other Ambulatory Visit: Payer: Self-pay | Admitting: Cardiology

## 2024-11-06 DIAGNOSIS — I639 Cerebral infarction, unspecified: Secondary | ICD-10-CM

## 2024-11-06 NOTE — Progress Notes (Signed)
 Ordering 30 day cardiac monitor, per request of neurology post stroke.  Results to Dr. Gollan

## 2024-11-20 ENCOUNTER — Telehealth: Payer: Self-pay

## 2024-11-20 ENCOUNTER — Telehealth: Payer: Self-pay | Admitting: *Deleted

## 2024-11-20 DIAGNOSIS — I639 Cerebral infarction, unspecified: Secondary | ICD-10-CM

## 2024-11-20 NOTE — Patient Outreach (Signed)
 Stroke Discharge Follow-up   11/20/2024 Name:  NICKOLUS WADDING MRN:  [id number] DOB:  1946/08/28  Subjective: WAEL MAESTAS is a 79 y.o. year old male who is a primary care patient of Montey Lot, PA-C An Emmi alert was received indicating patient responded to questions: Questions/problems with meds?. I reached out by phone to follow up on the alert and spoke to Patient.  Care Management Interventions: Spoke with pt about importance of taking medications as prescribed, continuing to follow up with primary care provider for management of GERD.  Unable to complete SDOH screenings and medication review due to pt states he is busy and does not have time.  Follow up plan: No further intervention required.   Mliss Creed [facility], BSN RN Care Manager/ Transition of Care [place]/ [facility] [phone]

## 2024-12-14 ENCOUNTER — Ambulatory Visit: Attending: Cardiovascular Disease

## 2024-12-14 ENCOUNTER — Telehealth: Payer: Self-pay | Admitting: Cardiovascular Disease

## 2024-12-14 DIAGNOSIS — I639 Cerebral infarction, unspecified: Secondary | ICD-10-CM

## 2024-12-16 DIAGNOSIS — I639 Cerebral infarction, unspecified: Secondary | ICD-10-CM

## 2024-12-19 ENCOUNTER — Ambulatory Visit: Admitting: Medical

## 2024-12-22 ENCOUNTER — Ambulatory Visit: Payer: Self-pay | Admitting: Cardiovascular Disease

## 2025-01-02 ENCOUNTER — Ambulatory Visit: Admitting: Diagnostic Neuroimaging

## 2025-01-26 ENCOUNTER — Ambulatory Visit: Admitting: Cardiology
